# Patient Record
Sex: Male | Born: 1975 | Race: White | Hispanic: No | Marital: Single | Smoking: Current every day smoker
Health system: Southern US, Community
[De-identification: ages and names within clinical notes are randomized; demographics above are authoritative.]

## PROBLEM LIST (undated history)

## (undated) DIAGNOSIS — N189 Chronic kidney disease, unspecified: Secondary | ICD-10-CM

## (undated) DIAGNOSIS — M879 Osteonecrosis, unspecified: Secondary | ICD-10-CM

## (undated) DIAGNOSIS — M1990 Unspecified osteoarthritis, unspecified site: Secondary | ICD-10-CM

## (undated) DIAGNOSIS — K7290 Hepatic failure, unspecified without coma: Secondary | ICD-10-CM

## (undated) DIAGNOSIS — M719 Bursopathy, unspecified: Secondary | ICD-10-CM

## (undated) HISTORY — DX: Hepatic failure, unspecified without coma: K72.90

## (undated) HISTORY — DX: Osteonecrosis, unspecified: M87.9

## (undated) HISTORY — DX: Chronic kidney disease, unspecified: N18.9

---

## 1999-01-01 ENCOUNTER — Inpatient Hospital Stay (HOSPITAL_COMMUNITY): Admission: EM | Admit: 1999-01-01 | Discharge: 1999-01-07 | Payer: Self-pay | Admitting: Emergency Medicine

## 2004-02-21 ENCOUNTER — Emergency Department: Payer: Self-pay | Admitting: Emergency Medicine

## 2005-03-15 ENCOUNTER — Emergency Department: Payer: Self-pay | Admitting: Emergency Medicine

## 2005-11-05 ENCOUNTER — Emergency Department: Payer: Self-pay | Admitting: Emergency Medicine

## 2005-11-16 ENCOUNTER — Emergency Department: Payer: Self-pay | Admitting: Emergency Medicine

## 2005-12-21 ENCOUNTER — Emergency Department: Payer: Self-pay | Admitting: Internal Medicine

## 2006-04-10 ENCOUNTER — Emergency Department: Payer: Self-pay | Admitting: Emergency Medicine

## 2007-08-04 IMAGING — CR CERVICAL SPINE - COMPLETE 4+ VIEW
1 series · 6 of 6 positions shown · non-contrast
Comparison: none

REASON FOR EXAM: fall
COMMENTS:

[Series 1: view not recorded · 0.17mm/px · 6 of 6 slices shown]
[im 1/6]
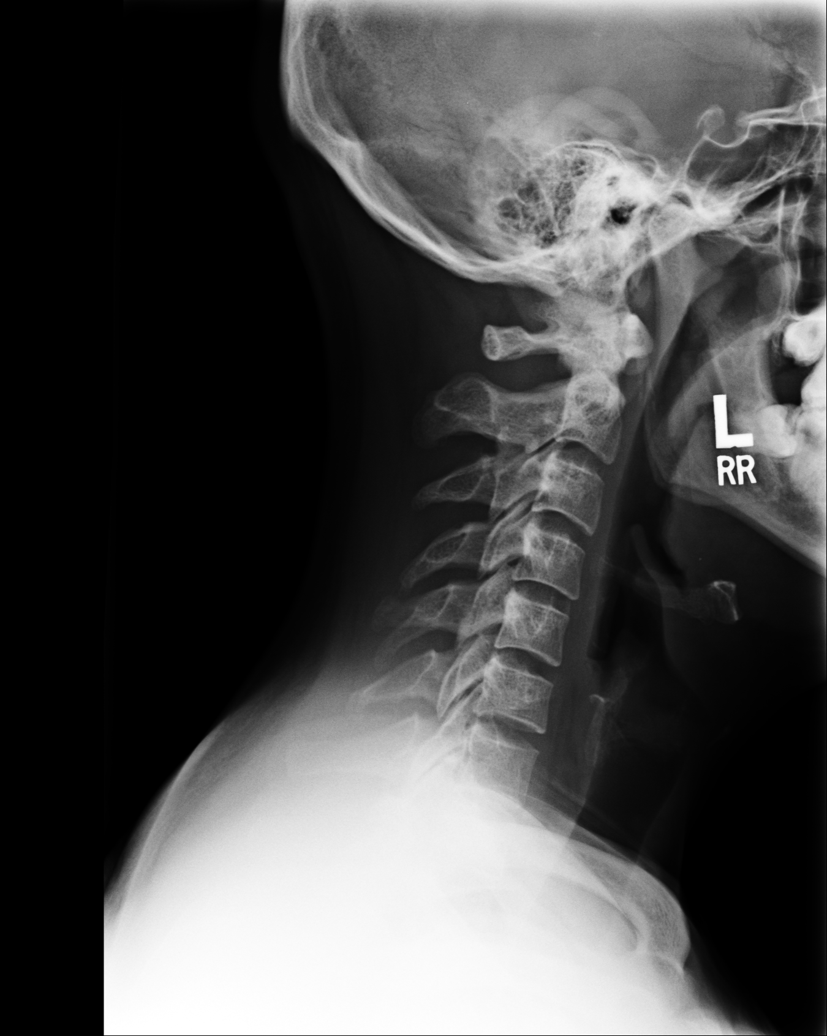
[im 2/6]
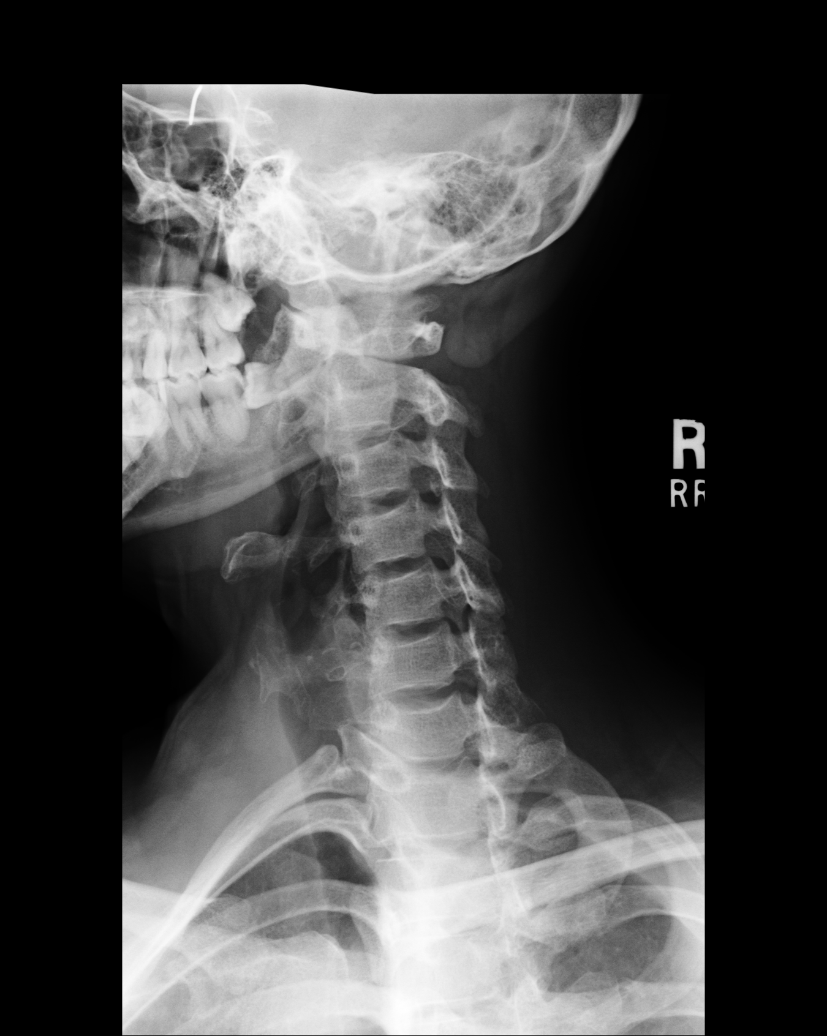
[im 3/6]
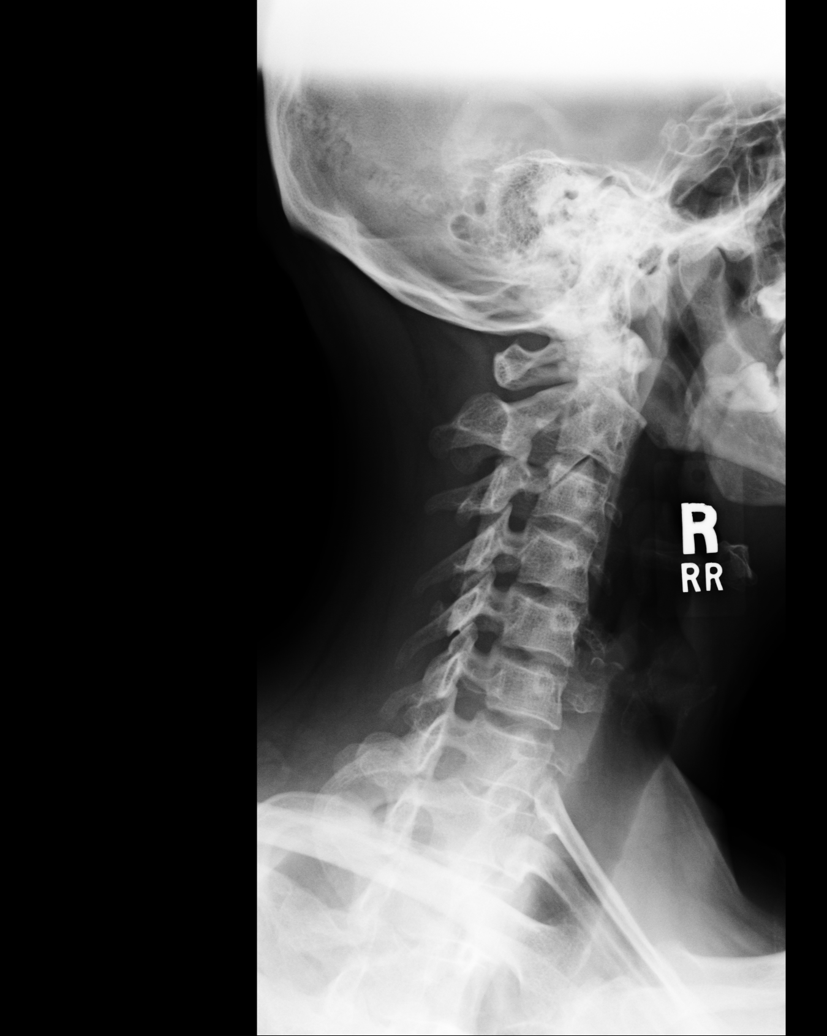
[im 4/6]
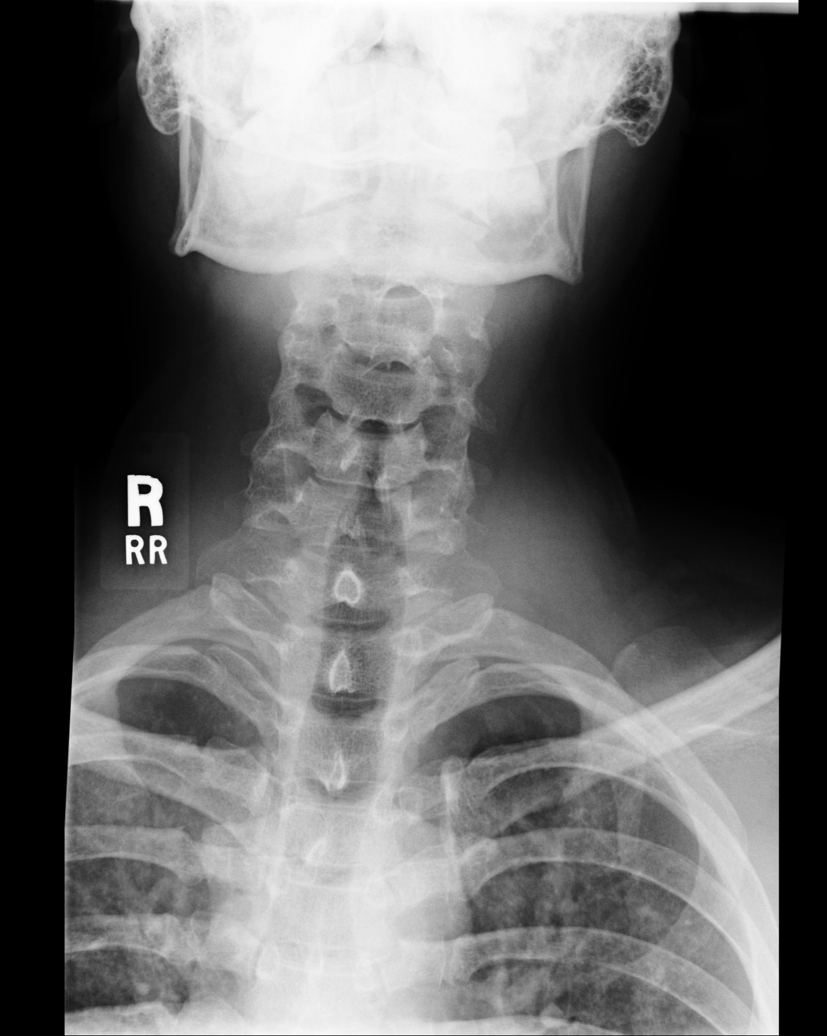
[im 5/6]
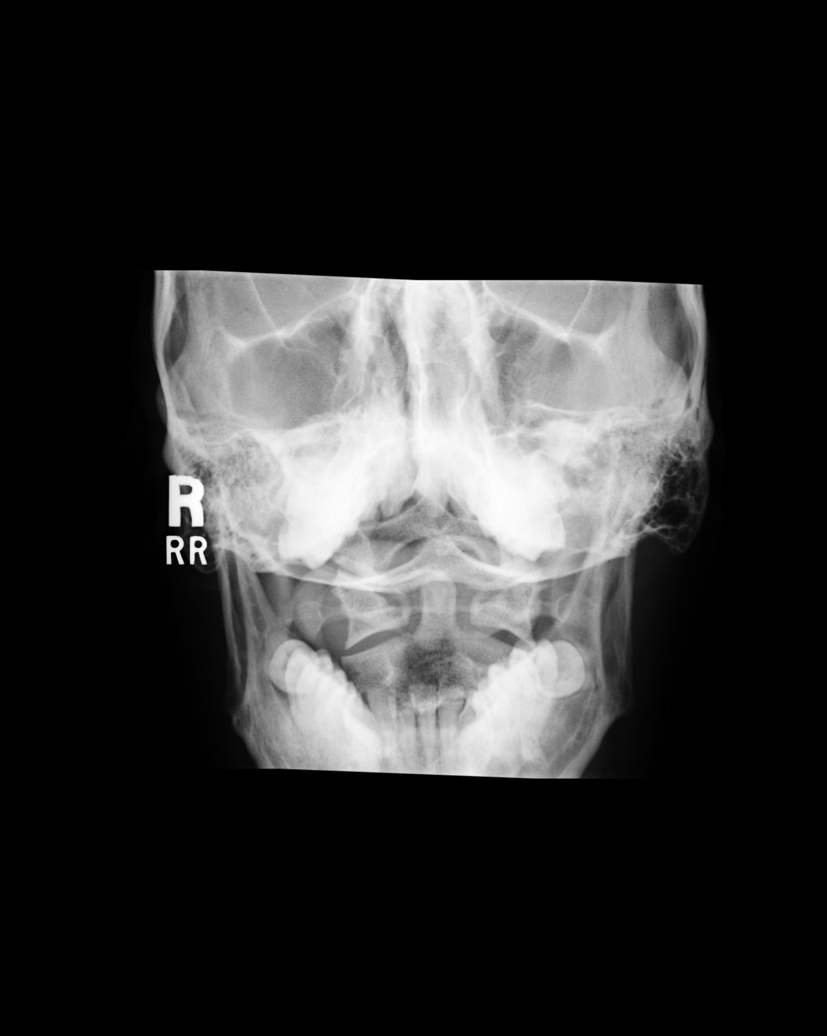
[im 6/6]
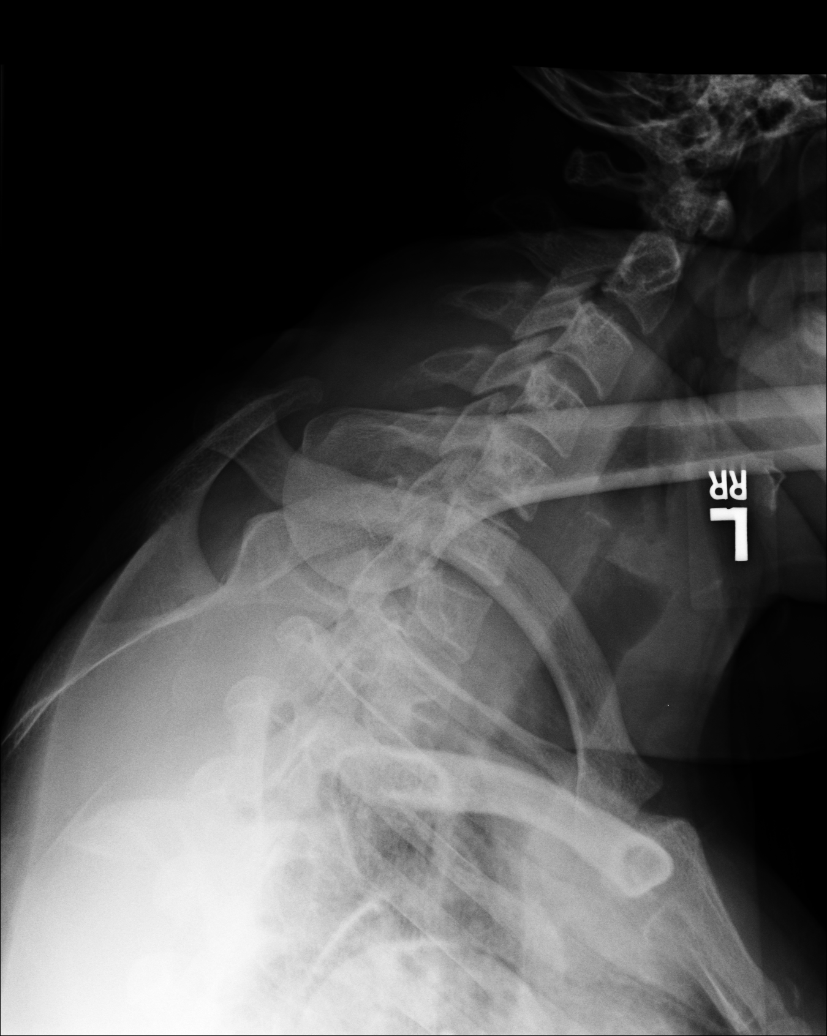

[6 of 6 positions shown; findings below may reference images not displayed]

PROCEDURE:     DXR - DXR CERVICAL SPINE COMPLETE  - November 05, 2005 [DATE]

RESULT:          A complete cervical spine series shows straightening of the
normal cervical lordosis.  A discrete fracture is not evident.  The
prevertebral soft tissues are normal.  The facets appear to be normally
aligned, and the foramina appear to be patent.  The craniocervical junction
and C1-C2 articulation appear to be unremarkable.
IMPRESSION: Loss of the normal cervical lordosis which may be
secondary to muscle spasm.  No definite fracture is seen.  If the patient
has focal neurologic symptoms, then MRI or follow-up cervical spine CT could
be considered.

## 2007-08-15 IMAGING — CT CT ABD-PELV W/ CM
1 of 2 series · 14 of 32 positions shown, 18 images · non-contrast
Comparison: none

REASON FOR EXAM: (1)  Appendicitis; (2) appendicitis rme1
COMMENTS:

[Series 2: soft tissue · axial · 0.67mm/px · z∈[-1600,-1210]mm · 14 of 147 slices shown, 18 images]
[im 11/147  soft-tissue]
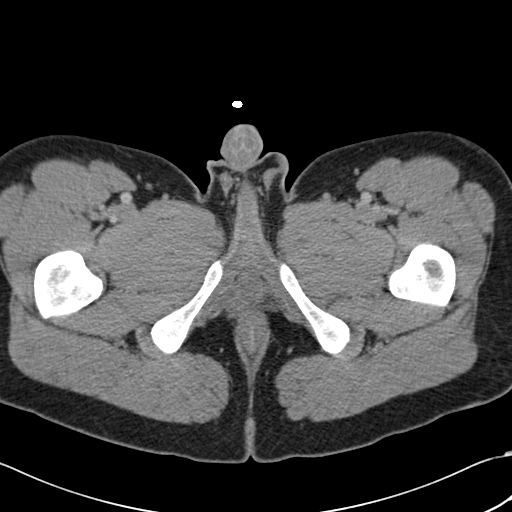
[im 11/147  bone]
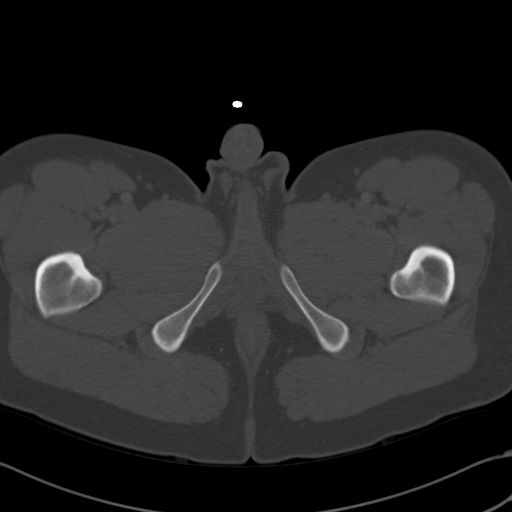
[im 22/147  soft-tissue]
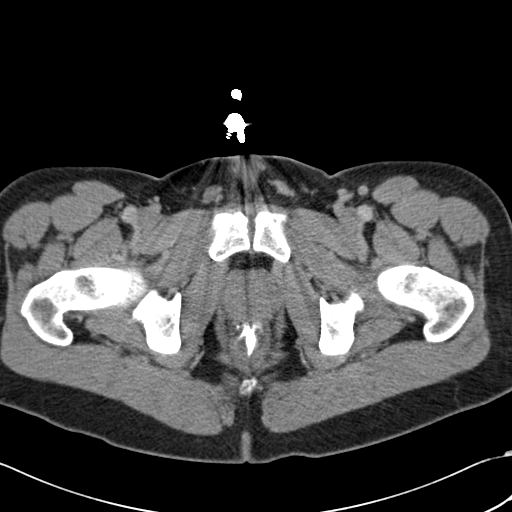
[im 33/147  soft-tissue]
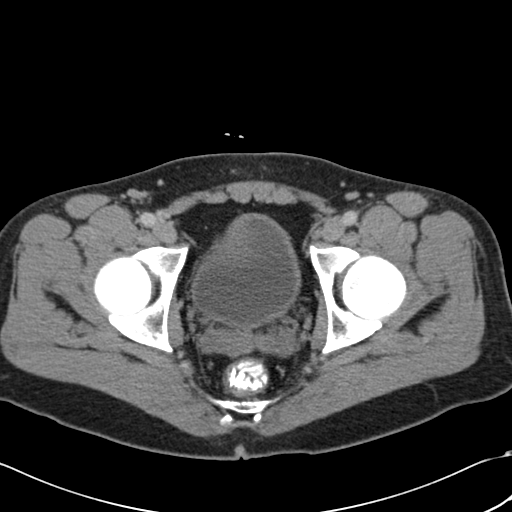
[im 44/147  soft-tissue]
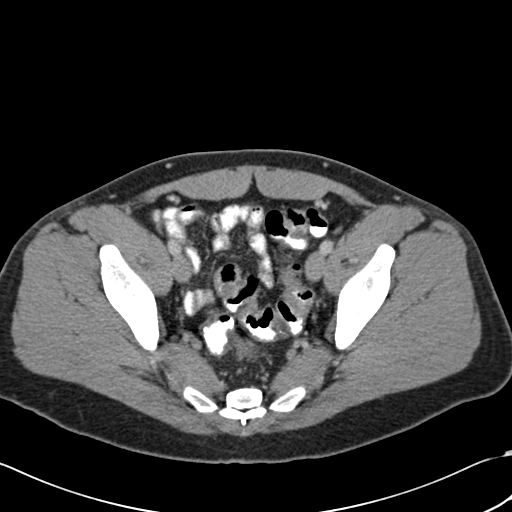
[im 55/147  soft-tissue]
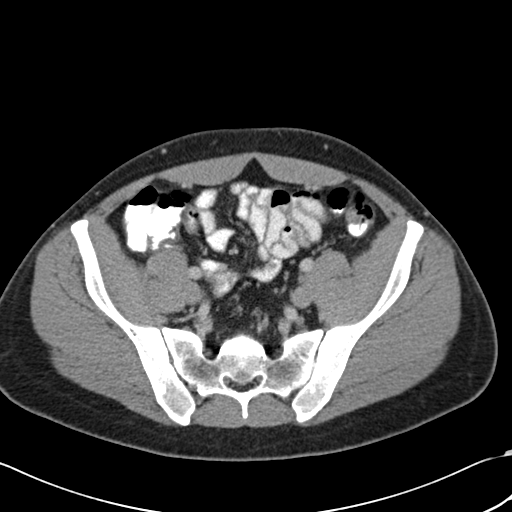
[im 65/147  soft-tissue]
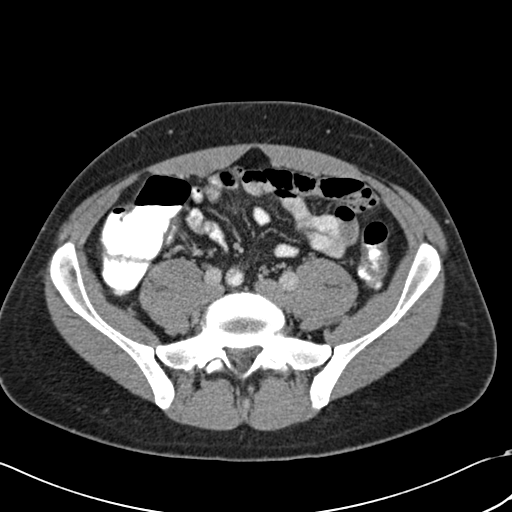
[im 82/147  soft-tissue]
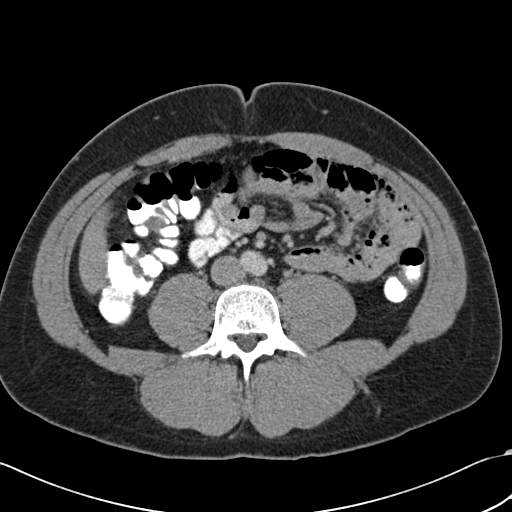
[im 92/147  soft-tissue]
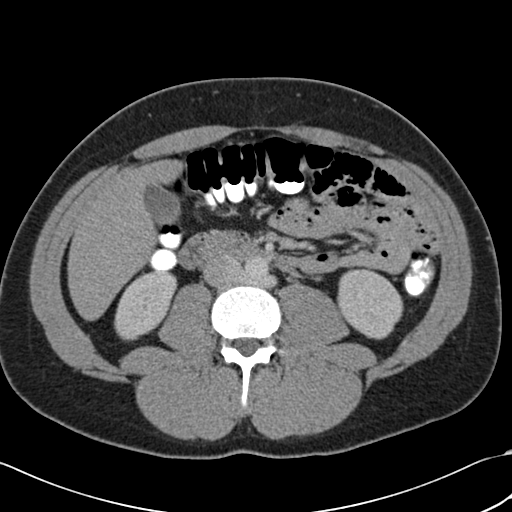
[im 103/147  soft-tissue]
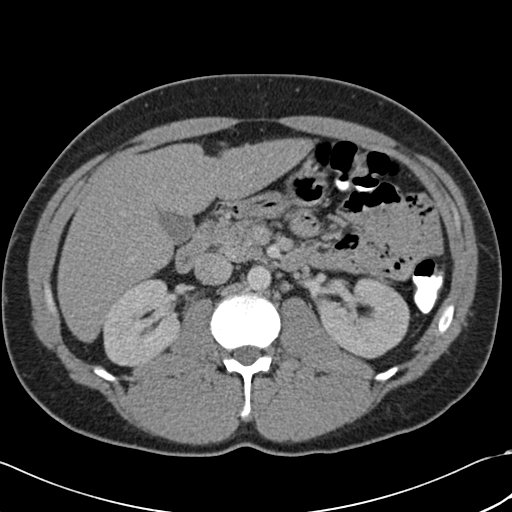
[im 103/147  bone]
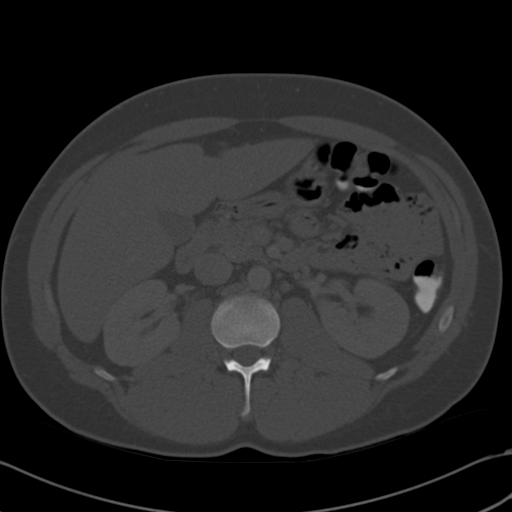
[im 114/147  soft-tissue]
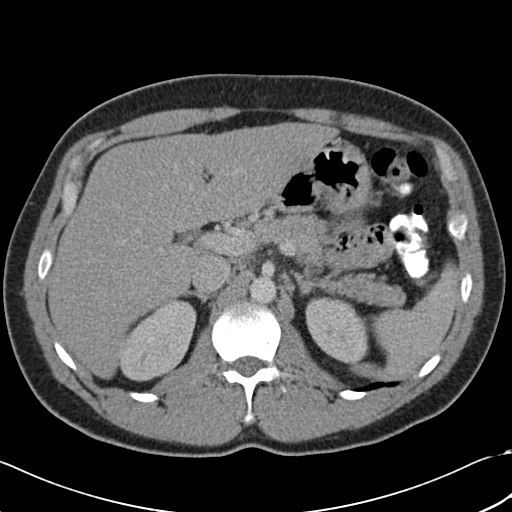
[im 125/147  soft-tissue]
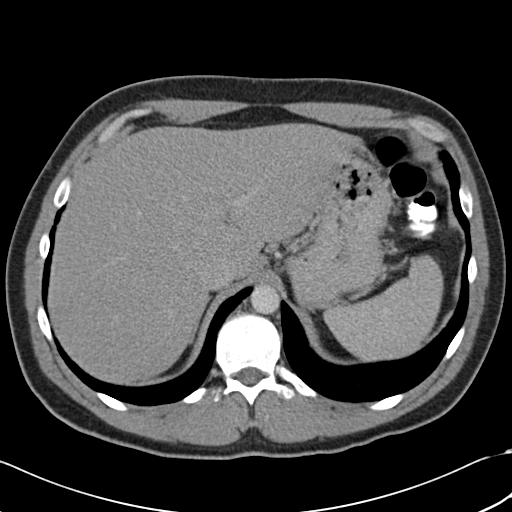
[im 125/147  lung]
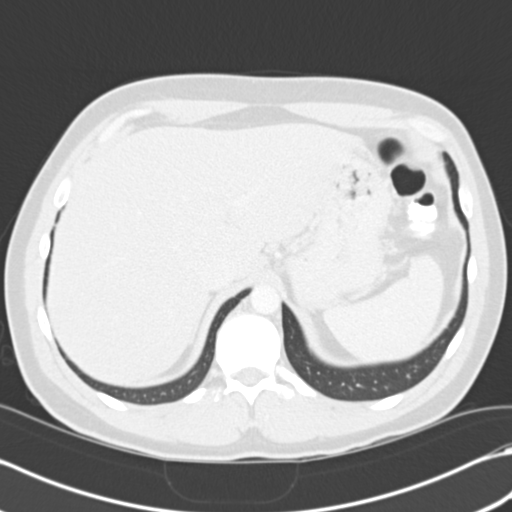
[im 130/147  lung]
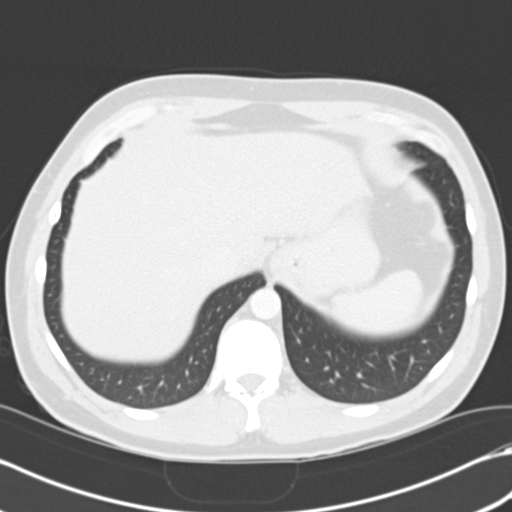
[im 136/147  soft-tissue]
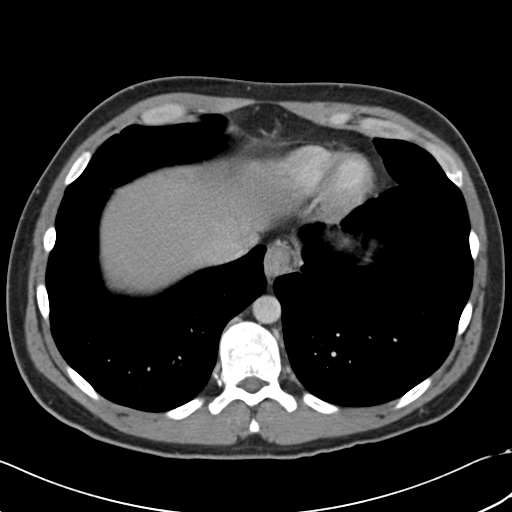
[im 136/147  lung]
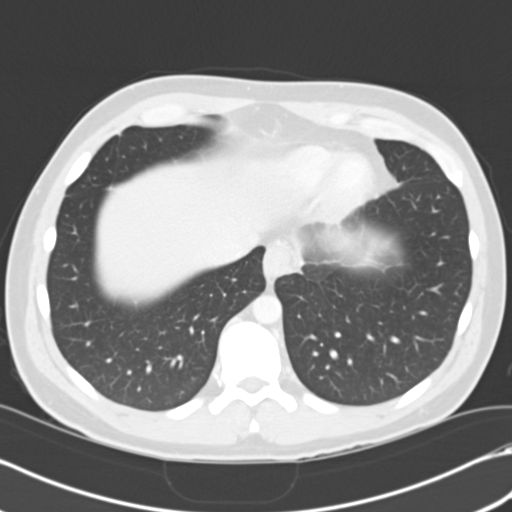
[im 141/147  lung]
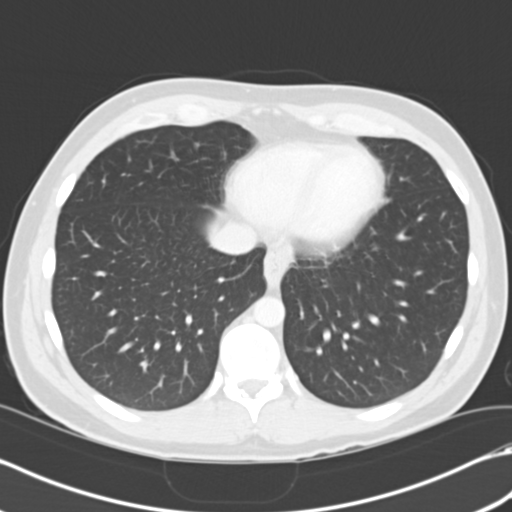

[14 of 32 positions shown; findings below may reference images not displayed]

PROCEDURE:     CT  - CT ABDOMEN / PELVIS  W  - November 16, 2005  [DATE]

RESULT:       The patient is complaining of chronic left lower quadrant
abdominal discomfort but it has worsened more recently.  The patient has
experienced vomiting and diarrhea.

The patient received 100 ml of Osovue-SUJ as well as oral contrast material.
The volume of contrast is relatively low and it is confined to the colon. I
do not see evidence of obstruction. There is thickening of the wall of the
mid and distal sigmoid colon but I do not see significant inflammatory
changes in the surrounding fat. The descending colon is grossly normal. The
ascending and transverse colons exhibit no acute abnormality either. The
small bowel contains only a small amount of the orally administered
contrast. This is predominantly within the ileum.

The liver, gallbladder, pancreas, spleen, nondistended stomach, adrenal
glands, and kidneys are normal in appearance. The caliber of the abdominal
aorta is normal. I see no pathologic sized intraperitoneal or
retroperitoneal lymph nodes. There is no free fluid in the pelvis. The
urinary bladder is partially distended. The prostate gland is mildly
enlarged produces a moderate impression upon the urinary bladder base. There
is no inguinal lymphadenopathy nor evidence of an inguinal hernia. The lung
bases are clear.
IMPRESSION: 1. There is thickening of the wall of the rectum and portions of the sigmoid
colon. This may reflect an inflammatory or infiltrative process. No evidence
of obstruction is seen. There is no evidence of abscess or free fluid or
perforation.
2. The remaining visualized portions of the colon are normal in appearance.
3. I see no evidence of hepatobiliary abnormality nor evidence of urinary
tract obstruction.  A nonobstructing 1 mm diameter midpole calyceal stone on
the right is present. There is also enlargement of the prostate gland with a
prominent impression upon the urinary bladder base.
4.  I do not seen findings suspicious for acute appendicitis.

The findings were called to Dr. Beyer in the Emergency Room in the
conclusion of the study.

## 2013-07-23 ENCOUNTER — Emergency Department: Payer: Self-pay | Admitting: Emergency Medicine

## 2013-07-23 LAB — URINALYSIS, COMPLETE
Bacteria: NONE SEEN
Bilirubin,UR: NEGATIVE
Blood: NEGATIVE
Glucose,UR: NEGATIVE mg/dL (ref 0–75)
Ketone: NEGATIVE
Leukocyte Esterase: NEGATIVE
NITRITE: NEGATIVE
Ph: 6 (ref 4.5–8.0)
Protein: NEGATIVE
SQUAMOUS EPITHELIAL: NONE SEEN
Specific Gravity: 1.016 (ref 1.003–1.030)
WBC UR: 2 /HPF (ref 0–5)

## 2013-07-23 LAB — COMPREHENSIVE METABOLIC PANEL
ALK PHOS: 125 U/L — AB
AST: 38 U/L — AB (ref 15–37)
Albumin: 3.6 g/dL (ref 3.4–5.0)
Anion Gap: 8 (ref 7–16)
BILIRUBIN TOTAL: 2.2 mg/dL — AB (ref 0.2–1.0)
BUN: 13 mg/dL (ref 7–18)
CALCIUM: 9.6 mg/dL (ref 8.5–10.1)
Chloride: 109 mmol/L — ABNORMAL HIGH (ref 98–107)
Co2: 20 mmol/L — ABNORMAL LOW (ref 21–32)
Creatinine: 1.21 mg/dL (ref 0.60–1.30)
EGFR (Non-African Amer.): >60
GLUCOSE: 96 mg/dL (ref 65–99)
Osmolality: 274 (ref 275–301)
POTASSIUM: 3.8 mmol/L (ref 3.5–5.1)
SGPT (ALT): 20 U/L (ref 12–78)
Sodium: 137 mmol/L (ref 136–145)
Total Protein: 7.6 g/dL (ref 6.4–8.2)

## 2013-07-23 LAB — LIPASE, BLOOD: Lipase: 168 U/L (ref 73–393)

## 2013-07-23 LAB — CBC WITH DIFFERENTIAL/PLATELET
BASOS PCT: 1.1 %
Basophil #: 0.1 10*3/uL (ref 0.0–0.1)
Eosinophil #: 0.2 10*3/uL (ref 0.0–0.7)
Eosinophil %: 2.3 %
HCT: 30.6 % — ABNORMAL LOW (ref 40.0–52.0)
HGB: 9.7 g/dL — ABNORMAL LOW (ref 13.0–18.0)
Lymphocyte #: 1.4 10*3/uL (ref 1.0–3.6)
Lymphocyte %: 18 %
MCH: 26 pg (ref 26.0–34.0)
MCHC: 31.7 g/dL — AB (ref 32.0–36.0)
MCV: 82 fL (ref 80–100)
MONOS PCT: 8.3 %
Monocyte #: 0.6 x10 3/mm (ref 0.2–1.0)
Neutrophil #: 5.3 10*3/uL (ref 1.4–6.5)
Neutrophil %: 70.3 %
Platelet: 143 10*3/uL — ABNORMAL LOW (ref 150–440)
RBC: 3.74 10*6/uL — AB (ref 4.40–5.90)
RDW: 16.9 % — ABNORMAL HIGH (ref 11.5–14.5)
WBC: 7.6 10*3/uL (ref 3.8–10.6)

## 2019-02-15 HISTORY — PX: REPLACEMENT TOTAL KNEE: SUR1224

## 2019-02-15 HISTORY — PX: TOTAL HIP ARTHROPLASTY: SHX124

## 2021-02-16 ENCOUNTER — Encounter: Payer: Self-pay | Admitting: Emergency Medicine

## 2021-02-16 ENCOUNTER — Emergency Department
Admission: EM | Admit: 2021-02-16 | Discharge: 2021-02-16 | Disposition: A | Discharge status: Home / Self Care | Payer: Self-pay | Attending: Emergency Medicine | Admitting: Emergency Medicine

## 2021-02-16 DIAGNOSIS — R7309 Other abnormal glucose: Secondary | ICD-10-CM | POA: Insufficient documentation

## 2021-02-16 DIAGNOSIS — Z20822 Contact with and (suspected) exposure to covid-19: Secondary | ICD-10-CM | POA: Insufficient documentation

## 2021-02-16 DIAGNOSIS — T40411A Poisoning by fentanyl or fentanyl analogs, accidental (unintentional), initial encounter: Secondary | ICD-10-CM | POA: Insufficient documentation

## 2021-02-16 DIAGNOSIS — T40601A Poisoning by unspecified narcotics, accidental (unintentional), initial encounter: Secondary | ICD-10-CM

## 2021-02-16 HISTORY — DX: Unspecified osteoarthritis, unspecified site: M19.90

## 2021-02-16 LAB — ETHANOL: Alcohol, Ethyl (B): 245 mg/dL — ABNORMAL HIGH (ref <10)

## 2021-02-16 LAB — CBC
HCT: 33.9 % — ABNORMAL LOW (ref 39.0–52.0)
Hemoglobin: 10 g/dL — ABNORMAL LOW (ref 13.0–17.0)
MCH: 22.7 pg — ABNORMAL LOW (ref 26.0–34.0)
MCHC: 29.5 g/dL — ABNORMAL LOW (ref 30.0–36.0)
MCV: 76.9 fL — ABNORMAL LOW (ref 80.0–100.0)
Platelets: 150 10*3/uL (ref 150–400)
RBC: 4.41 MIL/uL (ref 4.22–5.81)
RDW: 19.8 % — ABNORMAL HIGH (ref 11.5–15.5)
WBC: 3.7 10*3/uL — ABNORMAL LOW (ref 4.0–10.5)
nRBC: 0 % (ref 0.0–0.2)

## 2021-02-16 LAB — RESP PANEL BY RT-PCR (FLU A&B, COVID) ARPGX2
Influenza A by PCR: NEGATIVE
Influenza B by PCR: NEGATIVE
SARS Coronavirus 2 by RT PCR: NEGATIVE

## 2021-02-16 LAB — SALICYLATE LEVEL: Salicylate Lvl: <7 mg/dL — ABNORMAL LOW (ref 7.0–30.0)

## 2021-02-16 LAB — COMPREHENSIVE METABOLIC PANEL
ALT: 33 U/L (ref 0–44)
AST: 65 U/L — ABNORMAL HIGH (ref 15–41)
Albumin: 3.4 g/dL — ABNORMAL LOW (ref 3.5–5.0)
Alkaline Phosphatase: 140 U/L — ABNORMAL HIGH (ref 38–126)
Anion gap: 7 (ref 5–15)
BUN: 14 mg/dL (ref 6–20)
CO2: 21 mmol/L — ABNORMAL LOW (ref 22–32)
Calcium: 8.9 mg/dL (ref 8.9–10.3)
Chloride: 116 mmol/L — ABNORMAL HIGH (ref 98–111)
Creatinine, Ser: 0.91 mg/dL (ref 0.61–1.24)
GFR, Estimated: >60 mL/min (ref >60)
Glucose, Bld: 121 mg/dL — ABNORMAL HIGH (ref 70–99)
Potassium: 4 mmol/L (ref 3.5–5.1)
Sodium: 144 mmol/L (ref 135–145)
Total Bilirubin: 1.5 mg/dL — ABNORMAL HIGH (ref 0.3–1.2)
Total Protein: 7.7 g/dL (ref 6.5–8.1)

## 2021-02-16 LAB — CBG MONITORING, ED: Glucose-Capillary: 150 mg/dL — ABNORMAL HIGH (ref 70–99)

## 2021-02-16 LAB — TROPONIN I (HIGH SENSITIVITY): Troponin I (High Sensitivity): 5 ng/L (ref <18)

## 2021-02-16 LAB — ACETAMINOPHEN LEVEL: Acetaminophen (Tylenol), Serum: <10 ug/mL — ABNORMAL LOW (ref 10–30)

## 2021-02-16 MED ORDER — SODIUM CHLORIDE 0.9 % IV BOLUS
1000.0000 mL | Freq: Once | INTRAVENOUS | Status: AC
  Administered 2021-02-16: 1000 mL via INTRAVENOUS

## 2021-02-16 NOTE — ED Provider Notes (Signed)
[facility] Provider Note    Event Date/Time   First MD Initiated Contact with Patient 02/16/21 2107     (approximate)   History   Drug Overdose   HPI  Stephen Mayer is a 46 y.o. male with a history of chronic pain and fentanyl abuse who presents with an unintentional overdose of fentanyl.  Per EMS, the patient was found in his apartment where he was witnessed collapsing to the ground.  EMS noted agonal respirations on their arrival.  They gave Narcan intranasally and then IV.  The patient immediately awoke and became fully alert and oriented.  The patient endorses using fentanyl and drinking alcohol.  He states that the overdose was accidental, denies any SI or HI or any other intention for self-harm.  He denies any shortness of breath, nausea, or other acute symptoms and states he feels fine.     Physical Exam   Triage Vital Signs: ED Triage Vitals  Enc Vitals Group     BP 02/16/21 2029 128/76     Pulse Rate 02/16/21 2029 (!) 109     Resp 02/16/21 2029 12     Temp 02/16/21 2029 98.5 F (36.9 C)     Temp Source 02/16/21 2029 Oral     SpO2 02/16/21 2029 100 %     Weight 02/16/21 2023 150 lb (68 kg)     Height 02/16/21 2023 5\' 7"  (1.702 m)     Head Circumference --      Peak Flow --      Pain Score --      Pain Loc --      Pain Edu? --      Excl. in GC? --     Most recent vital signs: Vitals:   02/16/21 2130 02/16/21 2200  BP: 129/87 123/85  Pulse: (!) 114 100  Resp: 19   Temp:    SpO2: 98% 98%     General: Awake, oriented x4, no distress.  CV:  Good peripheral perfusion.  Resp:  Normal effort.  Abd:  Soft and nontender.  No distention.  Other:  Motor and sensory intact in all extremities.  No trauma.   ED Results / Procedures / Treatments   Labs (all labs ordered are listed, but only abnormal results are displayed) Labs Reviewed  COMPREHENSIVE METABOLIC PANEL - Abnormal; Notable for the following components:      Result  Value   Chloride 116 (*)    CO2 21 (*)    Glucose, Bld 121 (*)    Albumin 3.4 (*)    AST 65 (*)    Alkaline Phosphatase 140 (*)    Total Bilirubin 1.5 (*)    All other components within normal limits  ETHANOL - Abnormal; Notable for the following components:   Alcohol, Ethyl (B) 245 (*)    All other components within normal limits  SALICYLATE LEVEL - Abnormal; Notable for the following components:   Salicylate Lvl <7.0 (*)    All other components within normal limits  ACETAMINOPHEN LEVEL - Abnormal; Notable for the following components:   Acetaminophen (Tylenol), Serum <10 (*)    All other components within normal limits  CBC - Abnormal; Notable for the following components:   WBC 3.7 (*)    Hemoglobin 10.0 (*)    HCT 33.9 (*)    MCV 76.9 (*)    MCH 22.7 (*)    MCHC 29.5 (*)    RDW 19.8 (*)  All other components within normal limits  CBG MONITORING, ED - Abnormal; Notable for the following components:   Glucose-Capillary 150 (*)    All other components within normal limits  RESP PANEL BY RT-PCR (FLU A&B, COVID) ARPGX2  URINE DRUG SCREEN, QUALITATIVE ([facility] ONLY)  TROPONIN I (HIGH SENSITIVITY)     EKG  ED ECG REPORT I, Dionne BucySebastian Cheryal Salas, the attending physician, personally viewed and interpreted this ECG.  Date: 02/16/2021 EKG Time: 2025 Rate: 120 Rhythm: sinus tachycardia QRS Axis: left axis Intervals: normal ST/T Wave abnormalities: normal; early repolarization Narrative Interpretation: no evidence of acute ischemia    RADIOLOGY   PROCEDURES:  Critical Care performed: No  Procedures   MEDICATIONS ORDERED IN ED: Medications  sodium chloride 0.9 % bolus 1,000 mL (1,000 mLs Intravenous New Bag/Given 02/16/21 2150)     IMPRESSION / MDM / ASSESSMENT AND PLAN / ED COURSE  I reviewed the triage vital signs and the nursing notes.  46 year old male with history of chronic pain and fentanyl abuse presents after an apparent accidental fentanyl overdose.   He was also drinking alcohol.  He was found with agonal respirations but responded immediately to IV Narcan given by EMS.  He denies any acute complaints at this time and denies SI.  I reviewed the past medical records.  The patient's last medical encounter that we have a record of [facility].  At that time he was recommended for cervical medial branch block for his chronic pain.  He has not had any documented visits since that time.  On exam his vital signs are normal except for tachycardia.  O2 saturation is 100% on room air.  He is slightly intoxicated appearing but awake.  He does not demonstrate any respiratory distress or increased work of breathing.  Neuro exam is nonfocal and there is no visible trauma.  Overall presentation is consistent with accidental opiate overdose, likely with a component of alcohol intoxication as well.  We will obtain lab work-up, give fluids, and monitor the patient for approximately 3 hours.  The patient is on the cardiac monitor to evaluate for evidence of arrhythmia and/or significant heart rate changes.  ----------------------------------------- 10:55 PM on 02/16/2021 -----------------------------------------  Lab Is overall reassuring.  The patient's ethanol level is elevated consistent with acute intoxication.  Chemistry shows slightly elevated chloride and low bicarb as well as mildly elevated LFTs and bilirubin, all of which appear to be chronic for the patient based on old labs.  He is also anemic.  On reassessment, the patient's heart rate has normalized after fluids.  He remains awake, with normal spontaneous respirations and O2 saturation in the high 90s on room air.  The patient's father is here now and will be taking the patient home.  He states he will be with the patient tonight.  They both would like to go home at this time.  I had the patient walk and he is able to ambulate without difficulty.  He appears clinically  sober.  Since the patient has now been in ED for 2.5 hours with no recurrence of his symptoms, he is appropriate for discharge.  I gave them both return precautions and they expressed understanding.  I have provided a list of outpatient substance abuse resources.   FINAL CLINICAL IMPRESSION(S) / ED DIAGNOSES   Final diagnoses:  Opiate overdose, accidental or unintentional, initial encounter [facility](HCC)     Rx / DC Orders   ED Discharge Orders  None        Note:  This document was prepared using Dragon voice recognition software and may include unintentional dictation errors.    Dionne Bucy, MD 02/16/21 2258

## 2021-02-16 NOTE — ED Notes (Addendum)
Nurse walked into room and patient was at in room toilet and urinated. Unsteady on feet and pulled leads off. Let pt know to use urinal next time and we need sample and to not fall.

## 2021-02-16 NOTE — ED Notes (Signed)
Pulled IV- handed discharges to dad. Placed pt in wheelchair for tech to take to dad who was pulling around car.

## 2021-02-16 NOTE — ED Notes (Signed)
Went into room and pt agitated and trying to get out of bed and pull off all wires. Told patient to stay in bed and for urination he needed to use the urinal and not fall and we needed a sample. Patient confused and disoriented. Pt compliant and sat back in bed and reapplied leads.

## 2021-02-16 NOTE — ED Triage Notes (Signed)
Pt arrived via [facility] from apartment where he was witnessed collapsing to ground. EMS reports pt agonal breathing on arrival. EMS administered 4mg  Narcan intranasal with no response and then 4mg  IV Narcan with pt responding and becoming A&O. Pt arrived A&O x4, able to answer all question asked. Pt admits to ETOH and snorting Fentanyl. Pt denies Si and HI.

## 2022-06-22 ENCOUNTER — Other Ambulatory Visit: Payer: Self-pay

## 2022-06-22 ENCOUNTER — Emergency Department
Admission: EM | Admit: 2022-06-22 | Discharge: 2022-06-22 | Disposition: A | Discharge status: Home / Self Care | Payer: Medicaid - Out of State | Attending: Emergency Medicine | Admitting: Emergency Medicine

## 2022-06-22 DIAGNOSIS — K769 Liver disease, unspecified: Secondary | ICD-10-CM | POA: Diagnosis not present

## 2022-06-22 DIAGNOSIS — M7989 Other specified soft tissue disorders: Secondary | ICD-10-CM | POA: Diagnosis present

## 2022-06-22 DIAGNOSIS — R600 Localized edema: Secondary | ICD-10-CM | POA: Diagnosis not present

## 2022-06-22 LAB — BASIC METABOLIC PANEL
Anion gap: 5 (ref 5–15)
BUN: 8 mg/dL (ref 6–20)
CO2: 22 mmol/L (ref 22–32)
Calcium: 8 mg/dL — ABNORMAL LOW (ref 8.9–10.3)
Chloride: 111 mmol/L (ref 98–111)
Creatinine, Ser: 0.96 mg/dL (ref 0.61–1.24)
GFR, Estimated: >60 mL/min (ref >60)
Glucose, Bld: 76 mg/dL (ref 70–99)
Potassium: 3.5 mmol/L (ref 3.5–5.1)
Sodium: 138 mmol/L (ref 135–145)

## 2022-06-22 LAB — CBC
HCT: 32.3 % — ABNORMAL LOW (ref 39.0–52.0)
Hemoglobin: 9.2 g/dL — ABNORMAL LOW (ref 13.0–17.0)
MCH: 20.4 pg — ABNORMAL LOW (ref 26.0–34.0)
MCHC: 28.5 g/dL — ABNORMAL LOW (ref 30.0–36.0)
MCV: 71.5 fL — ABNORMAL LOW (ref 80.0–100.0)
Platelets: 129 10*3/uL — ABNORMAL LOW (ref 150–400)
RBC: 4.52 MIL/uL (ref 4.22–5.81)
RDW: 18.7 % — ABNORMAL HIGH (ref 11.5–15.5)
WBC: 6 10*3/uL (ref 4.0–10.5)
nRBC: 0 % (ref 0.0–0.2)

## 2022-06-22 LAB — BRAIN NATRIURETIC PEPTIDE: B Natriuretic Peptide: 106.1 pg/mL — ABNORMAL HIGH (ref 0.0–100.0)

## 2022-06-22 MED ORDER — FUROSEMIDE 20 MG PO TABS: 20.0000 mg | ORAL_TABLET | Freq: Every day | ORAL | 0 refills | Status: DC

## 2022-06-22 NOTE — ED Provider Notes (Signed)
[facility] Provider Note    Event Date/Time   First MD Initiated Contact with Patient 06/22/22 1556     (approximate)   History   Chief Complaint Leg Swelling   HPI  Stephen Mayer is a 47 y.o. male with past medical history of arthritis, chronic liver disease, and alcohol abuse who presents to the ED complaining of leg swelling.  Patient reports that over the past 3 days he has been dealing with increasing swelling and both of his legs.  He states that the legs are uncomfortable due to the swelling but denies overt pain.  He has not noticed any redness or wounds to the legs, denies any difficulty breathing or pain in his chest.  He states he has dealt with similar symptoms previously and reports a history of liver disease due to alcohol abuse.  He had been sober for a long time, but states that he took up drinking again about 6 months ago.  He recently relocated to the area from [place] and does not have a primary care doctor.     Physical Exam   Triage Vital Signs: ED Triage Vitals  Enc Vitals Group     BP 06/22/22 1434 124/71     Pulse Rate 06/22/22 1434 91     Resp 06/22/22 1434 18     Temp 06/22/22 1434 98.3 F (36.8 C)     Temp Source 06/22/22 1434 Oral     SpO2 06/22/22 1434 97 %     Weight 06/22/22 1434 180 lb (81.6 kg)     Height 06/22/22 1434 5\' 7"  (1.702 m)     Head Circumference --      Peak Flow --      Pain Score 06/22/22 1444 0     Pain Loc --      Pain Edu? --      Excl. in GC? --     Most recent vital signs: Vitals:   06/22/22 1434  BP: 124/71  Pulse: 91  Resp: 18  Temp: 98.3 F (36.8 C)  SpO2: 97%    Constitutional: Alert and oriented. Eyes: Conjunctivae are normal. Head: Atraumatic. Nose: No congestion/rhinnorhea. Mouth/Throat: Mucous membranes are moist.  Cardiovascular: Normal rate, regular rhythm. Grossly normal heart sounds.  2+ radial and DP pulses bilaterally. Respiratory: Normal respiratory effort.   No retractions. Lungs CTAB. Gastrointestinal: Soft and nontender. No distention. Musculoskeletal: No lower extremity tenderness, 1+ pitting edema to knees bilaterally. Neurologic:  Normal speech and language. No gross focal neurologic deficits are appreciated.    ED Results / Procedures / Treatments   Labs (all labs ordered are listed, but only abnormal results are displayed) Labs Reviewed  BASIC METABOLIC PANEL - Abnormal; Notable for the following components:      Result Value   Calcium 8.0 (*)    All other components within normal limits  CBC - Abnormal; Notable for the following components:   Hemoglobin 9.2 (*)    HCT 32.3 (*)    MCV 71.5 (*)    MCH 20.4 (*)    MCHC 28.5 (*)    RDW 18.7 (*)    Platelets 129 (*)    All other components within normal limits  BRAIN NATRIURETIC PEPTIDE - Abnormal; Notable for the following components:   B Natriuretic Peptide 106.1 (*)    All other components within normal limits  URINALYSIS, ROUTINE W REFLEX MICROSCOPIC  CBG MONITORING, ED    PROCEDURES:  Critical Care performed: No  Procedures   MEDICATIONS ORDERED IN ED: Medications - No data to display   IMPRESSION / MDM / ASSESSMENT AND PLAN / ED COURSE  I reviewed the triage vital signs and the nursing notes.                              47 y.o. male with past medical history of arthritis, alcohol abuse, and chronic liver disease who presents to the ED with increasing swelling in his legs over the past 2 days.  Patient's presentation is most consistent with acute presentation with potential threat to life or bodily function.  Differential diagnosis includes, but is not limited to, renal failure, liver failure, electrolyte abnormality, CHF, DVT, venous stasis.  Patient nontoxic-appearing and in no acute distress, vital signs are unremarkable.  He has peripheral edema, but is neurovascular intact to his bilateral lower extremities with strong DP pulses.  No ascites noted on  exam and he has no abdominal tenderness.  He denies any difficulty breathing and I doubt CHF.  Labs are reassuring with anemia stable compared to previous, no significant leukocytosis, electrode abnormality, or AKI.  Symptoms likely due to patient's known liver disease and I offered to check his LFTs here in the ED, however patient would like to be discharged as soon as possible.  He does not want to stay for additional labs, but would like a prescription for Lasix to see if this helps.  He was also counseled on keeping his legs elevated and use compression stockings.  Referral provided to establish with PCP and he was counseled to return to the ED for new or worsening symptoms.  Patient agrees with plan.      FINAL CLINICAL IMPRESSION(S) / ED DIAGNOSES   Final diagnoses:  Peripheral edema  Chronic liver disease     Rx / DC Orders   ED Discharge Orders          Ordered    furosemide (LASIX) 20 MG tablet  Daily        06/22/22 1650    Ambulatory Referral to Primary Care (Establish Care)        06/22/22 1650             Note:  This document was prepared using Dragon voice recognition software and may include unintentional dictation errors.   Chesley Noon, MD 06/22/22 [phone]

## 2022-06-22 NOTE — ED Triage Notes (Signed)
Pt presents to ED with c/o of BLE swelling. Pt states HX of liver cirrhosis. Pt states ABD swelling as well.

## 2022-08-24 ENCOUNTER — Encounter: Payer: Self-pay | Admitting: Emergency Medicine

## 2022-08-24 ENCOUNTER — Emergency Department: Payer: BLUE CROSS/BLUE SHIELD

## 2022-08-24 ENCOUNTER — Other Ambulatory Visit: Payer: Self-pay

## 2022-08-24 ENCOUNTER — Inpatient Hospital Stay
Admission: EM | Admit: 2022-08-24 | Discharge: 2022-08-27 | DRG: 378 | Discharge status: Against Medical Advice | Payer: BLUE CROSS/BLUE SHIELD | Attending: Internal Medicine | Admitting: Internal Medicine

## 2022-08-24 DIAGNOSIS — E872 Acidosis, unspecified: Secondary | ICD-10-CM | POA: Diagnosis not present

## 2022-08-24 DIAGNOSIS — E876 Hypokalemia: Secondary | ICD-10-CM | POA: Diagnosis present

## 2022-08-24 DIAGNOSIS — Z833 Family history of diabetes mellitus: Secondary | ICD-10-CM

## 2022-08-24 DIAGNOSIS — K920 Hematemesis: Secondary | ICD-10-CM | POA: Diagnosis not present

## 2022-08-24 DIAGNOSIS — K449 Diaphragmatic hernia without obstruction or gangrene: Secondary | ICD-10-CM | POA: Diagnosis present

## 2022-08-24 DIAGNOSIS — R112 Nausea with vomiting, unspecified: Principal | ICD-10-CM

## 2022-08-24 DIAGNOSIS — K922 Gastrointestinal hemorrhage, unspecified: Secondary | ICD-10-CM | POA: Diagnosis present

## 2022-08-24 DIAGNOSIS — I8510 Secondary esophageal varices without bleeding: Secondary | ICD-10-CM | POA: Diagnosis present

## 2022-08-24 DIAGNOSIS — K861 Other chronic pancreatitis: Secondary | ICD-10-CM | POA: Diagnosis present

## 2022-08-24 DIAGNOSIS — K766 Portal hypertension: Secondary | ICD-10-CM | POA: Diagnosis present

## 2022-08-24 DIAGNOSIS — F1010 Alcohol abuse, uncomplicated: Secondary | ICD-10-CM | POA: Diagnosis present

## 2022-08-24 DIAGNOSIS — G8929 Other chronic pain: Secondary | ICD-10-CM | POA: Diagnosis present

## 2022-08-24 DIAGNOSIS — Z823 Family history of stroke: Secondary | ICD-10-CM

## 2022-08-24 DIAGNOSIS — K7030 Alcoholic cirrhosis of liver without ascites: Secondary | ICD-10-CM | POA: Diagnosis present

## 2022-08-24 DIAGNOSIS — R7989 Other specified abnormal findings of blood chemistry: Secondary | ICD-10-CM | POA: Diagnosis not present

## 2022-08-24 DIAGNOSIS — F17210 Nicotine dependence, cigarettes, uncomplicated: Secondary | ICD-10-CM | POA: Diagnosis present

## 2022-08-24 DIAGNOSIS — Z79899 Other long term (current) drug therapy: Secondary | ICD-10-CM

## 2022-08-24 DIAGNOSIS — K2961 Other gastritis with bleeding: Principal | ICD-10-CM | POA: Diagnosis present

## 2022-08-24 DIAGNOSIS — Z5329 Procedure and treatment not carried out because of patient's decision for other reasons: Secondary | ICD-10-CM | POA: Diagnosis present

## 2022-08-24 DIAGNOSIS — D61818 Other pancytopenia: Secondary | ICD-10-CM | POA: Diagnosis present

## 2022-08-24 DIAGNOSIS — K8689 Other specified diseases of pancreas: Secondary | ICD-10-CM | POA: Insufficient documentation

## 2022-08-24 DIAGNOSIS — K3189 Other diseases of stomach and duodenum: Secondary | ICD-10-CM | POA: Diagnosis present

## 2022-08-24 LAB — COMPREHENSIVE METABOLIC PANEL
ALT: 82 U/L — ABNORMAL HIGH (ref 0–44)
AST: 226 U/L — ABNORMAL HIGH (ref 15–41)
Albumin: 3.3 g/dL — ABNORMAL LOW (ref 3.5–5.0)
Alkaline Phosphatase: 168 U/L — ABNORMAL HIGH (ref 38–126)
Anion gap: 13 (ref 5–15)
BUN: 10 mg/dL (ref 6–20)
CO2: 18 mmol/L — ABNORMAL LOW (ref 22–32)
Calcium: 8.1 mg/dL — ABNORMAL LOW (ref 8.9–10.3)
Chloride: 110 mmol/L (ref 98–111)
Creatinine, Ser: 0.78 mg/dL (ref 0.61–1.24)
GFR, Estimated: >60 mL/min (ref >60)
Glucose, Bld: 101 mg/dL — ABNORMAL HIGH (ref 70–99)
Potassium: 3.4 mmol/L — ABNORMAL LOW (ref 3.5–5.1)
Sodium: 141 mmol/L (ref 135–145)
Total Bilirubin: 8.5 mg/dL — ABNORMAL HIGH (ref 0.3–1.2)
Total Protein: 7.4 g/dL (ref 6.5–8.1)

## 2022-08-24 LAB — CBC
HCT: 26.8 % — ABNORMAL LOW (ref 39.0–52.0)
Hemoglobin: 8.1 g/dL — ABNORMAL LOW (ref 13.0–17.0)
MCH: 22.1 pg — ABNORMAL LOW (ref 26.0–34.0)
MCHC: 30.2 g/dL (ref 30.0–36.0)
MCV: 73.2 fL — ABNORMAL LOW (ref 80.0–100.0)
Platelets: 32 10*3/uL — ABNORMAL LOW (ref 150–400)
RBC: 3.66 MIL/uL — ABNORMAL LOW (ref 4.22–5.81)
RDW: 25.2 % — ABNORMAL HIGH (ref 11.5–15.5)
WBC: 3.4 10*3/uL — ABNORMAL LOW (ref 4.0–10.5)
nRBC: 0 % (ref 0.0–0.2)

## 2022-08-24 LAB — APTT: aPTT: 36 seconds (ref 24–36)

## 2022-08-24 LAB — PROTIME-INR
INR: 1.4 — ABNORMAL HIGH (ref 0.8–1.2)
Prothrombin Time: 17 seconds — ABNORMAL HIGH (ref 11.4–15.2)

## 2022-08-24 LAB — ACETAMINOPHEN LEVEL: Acetaminophen (Tylenol), Serum: <10 ug/mL — ABNORMAL LOW (ref 10–30)

## 2022-08-24 LAB — LIPASE, BLOOD: Lipase: 64 U/L — ABNORMAL HIGH (ref 11–51)

## 2022-08-24 MED ORDER — PANTOPRAZOLE 80MG IVPB - SIMPLE MED
80.0000 mg | Freq: Once | INTRAVENOUS | Status: AC
  Administered 2022-08-24: 80 mg via INTRAVENOUS
  Filled 2022-08-24: qty 100

## 2022-08-24 MED ORDER — FOLIC ACID 1 MG PO TABS
1.0000 mg | ORAL_TABLET | Freq: Every day | ORAL | Status: DC
  Administered 2022-08-24: 1 mg via ORAL
  Filled 2022-08-24: qty 1

## 2022-08-24 MED ORDER — THIAMINE HCL 100 MG/ML IJ SOLN: 100.0000 mg | Freq: Every day | INTRAMUSCULAR | Status: DC

## 2022-08-24 MED ORDER — SODIUM CHLORIDE 0.9 % IV SOLN
50.0000 ug/h | INTRAVENOUS | Status: DC
  Administered 2022-08-24 – 2022-08-26 (×2): 50 ug/h via INTRAVENOUS
  Filled 2022-08-24 (×7): qty 1

## 2022-08-24 MED ORDER — IOHEXOL 300 MG/ML  SOLN
100.0000 mL | Freq: Once | INTRAMUSCULAR | Status: AC | PRN
  Administered 2022-08-24: 100 mL via INTRAVENOUS

## 2022-08-24 MED ORDER — THIAMINE MONONITRATE 100 MG PO TABS
100.0000 mg | ORAL_TABLET | Freq: Every day | ORAL | Status: DC
  Administered 2022-08-24: 100 mg via ORAL
  Filled 2022-08-24: qty 1

## 2022-08-24 MED ORDER — ONDANSETRON HCL 4 MG/2ML IJ SOLN
4.0000 mg | Freq: Once | INTRAMUSCULAR | Status: AC
  Administered 2022-08-24: 4 mg via INTRAVENOUS
  Filled 2022-08-24: qty 2

## 2022-08-24 MED ORDER — OXYCODONE-ACETAMINOPHEN 5-325 MG PO TABS
1.0000 | ORAL_TABLET | Freq: Once | ORAL | Status: AC
  Administered 2022-08-24: 1 via ORAL
  Filled 2022-08-24: qty 1

## 2022-08-24 MED ORDER — ACETAMINOPHEN 325 MG PO TABS: 650.0000 mg | ORAL_TABLET | Freq: Once | ORAL | Status: DC

## 2022-08-24 MED ORDER — SODIUM CHLORIDE 0.9 % IV SOLN
2.0000 g | Freq: Once | INTRAVENOUS | Status: AC
  Administered 2022-08-24: 2 g via INTRAVENOUS
  Filled 2022-08-24: qty 20

## 2022-08-24 MED ORDER — SODIUM CHLORIDE 0.9 % IV BOLUS
1000.0000 mL | Freq: Once | INTRAVENOUS | Status: AC
  Administered 2022-08-24: 1000 mL via INTRAVENOUS

## 2022-08-24 MED ORDER — OXYCODONE HCL 5 MG PO TABS
5.0000 mg | ORAL_TABLET | Freq: Once | ORAL | Status: AC
  Administered 2022-08-24: 5 mg via ORAL
  Filled 2022-08-24: qty 1

## 2022-08-24 MED ORDER — LORAZEPAM 1 MG PO TABS
1.0000 mg | ORAL_TABLET | ORAL | Status: DC | PRN
  Administered 2022-08-24: 1 mg via ORAL
  Filled 2022-08-24: qty 1
  Filled 2022-08-24: qty 2

## 2022-08-24 MED ORDER — ADULT MULTIVITAMIN W/MINERALS CH
1.0000 | ORAL_TABLET | Freq: Every day | ORAL | Status: DC
  Administered 2022-08-24: 1 via ORAL
  Filled 2022-08-24: qty 1

## 2022-08-24 NOTE — H&P (Incomplete)
[place]   PATIENT NAME: Stephen Mayer    MR#:  [id number]  DATE OF BIRTH:  April 01, 1975  DATE OF ADMISSION:  08/24/2022  PRIMARY CARE PHYSICIAN: Pcp, No   Patient is coming from: Home  REQUESTING/REFERRING PHYSICIAN: Orvil Feil, PA-C   CHIEF COMPLAINT:   Chief Complaint  Patient presents with   Abdominal Pain   Diarrhea    HISTORY OF PRESENT ILLNESS:  Stephen Mayer is a 47 y.o. male with medical history significant for alcohol use, GERD, osteonecrosis, pancreatic insufficiency as well as alcoholic liver cirrhosis, who presented to the emergency room with acute onset of right upper quadrant abdominal pain with associated nausea and hematemesis with bright red blood which have been intermittent over the last 5 days with several episodes per day.  He denies any melena or bright red blood per rectum.  No fever or chills.  No dysuria, oliguria or hematuria or flank pain.  No cough or wheezing or hemoptysis.  No other bleeding diathesis.  No chest pain or palpitations.  ED Course: When he came to the ER, BP was 153/80 with a heart rate of 109 with otherwise normal vital signs.  Labs revealed mild hypokalemia 3.4 and CO2 of 18, alk phos 168, albumin 3.3 and total protein 7.4 lipase was 64 in AST to 26 and ALT 82 with total bili of 8.5 up from 1.5 previously.  Blood group was A+ with negative antibody screen. EKG as reviewed by me : None. Imaging: 2 view chest x-ray showed no acute cardiopulmonary disease.  Right upper quadrant ultrasound showed signs of cirrhosis and it was marked limitation due to body habitus and overlying bowel gas.  The patient was given 4 mg of IV Zofran, 1 p.o. oxycodone 5 mg, 1 L bolus of IV normal saline, 100 mg of p.o. thiamine, IV Protonix bolus of 80 mg as well as IV octreotide bolus of 50 mcg.  He will be admitted to a progressive unit bed for further evaluation and management. PAST MEDICAL HISTORY:   Past Medical History:  Diagnosis Date    Arthritis   Alcohol abuse GERD Pancreatic insufficiency Alcoholic liver cirrhosis Osteonecrosis PAST SURGICAL HISTORY:  History reviewed. No pertinent surgical history. No previous surgeries. SOCIAL HISTORY:   Social History   Tobacco Use   Smoking status: Every Day    Types: Cigarettes   Smokeless tobacco: Never  Substance Use Topics   Alcohol use: Yes    FAMILY HISTORY:  Positive for diabetes mellitus in his mother and CVA in his father.  DRUG ALLERGIES:  No Known Allergies  REVIEW OF SYSTEMS:   ROS As per history of present illness. All pertinent systems were reviewed above. Constitutional, HEENT, cardiovascular, respiratory, GI, GU, musculoskeletal, neuro, psychiatric, endocrine, integumentary and hematologic systems were reviewed and are otherwise negative/unremarkable except for positive findings mentioned above in the HPI.   MEDICATIONS AT HOME:   Prior to Admission medications   Medication Sig Start Date End Date Taking? Authorizing Provider  furosemide (LASIX) 20 MG tablet Take 1 tablet (20 mg total) by mouth daily for 5 days. 06/22/22 06/27/22  Chesley Noon, MD      VITAL SIGNS:  Blood pressure 134/79, pulse 78, temperature 98.1 F (36.7 C), temperature source Oral, resp. rate 16, height 5\' 7"  (1.702 m), weight 81.6 kg, SpO2 98%.  PHYSICAL EXAMINATION:  Physical Exam  GENERAL:  47 y.o.-year-old male patient lying in the bed with no acute distress.  EYES: Pupils  equal, round, reactive to light and accommodation.  Positive scleral icterus. Extraocular muscles intact.  HEENT: Head atraumatic, normocephalic. Oropharynx and nasopharynx clear.  NECK:  Supple, no jugular venous distention. No thyroid enlargement, no tenderness.  LUNGS: Normal breath sounds bilaterally, no wheezing, rales,rhonchi or crepitation. No use of accessory muscles of respiration.  CARDIOVASCULAR: Regular rate and rhythm, S1, S2 normal. No murmurs, rubs, or gallops.  ABDOMEN: Soft,  nondistended, with minimal epigastric and right upper quadrant tenderness without rebound tenderness guarding or rigidity. Bowel sounds present. No organomegaly or mass.  EXTREMITIES: 1+ bilateral lower extremity pitting pedal edema, with no cyanosis, or clubbing.  NEUROLOGIC: Cranial nerves II through XII are intact. Muscle strength 5/5 in all extremities. Sensation intact. Gait not checked.  PSYCHIATRIC: The patient is alert and oriented x 3.  Normal affect and good eye contact. SKIN: No obvious rash, lesion, or ulcer.   LABORATORY PANEL:   CBC Recent Labs  Lab 08/24/22 1348  WBC 3.4*  HGB 8.1*  HCT 26.8*  PLT 32*   ------------------------------------------------------------------------------------------------------------------  Chemistries  Recent Labs  Lab 08/24/22 1348  NA 141  K 3.4*  CL 110  CO2 18*  GLUCOSE 101*  BUN 10  CREATININE 0.78  CALCIUM 8.1*  AST 226*  ALT 82*  ALKPHOS 168*  BILITOT 8.5*   ------------------------------------------------------------------------------------------------------------------  Cardiac Enzymes No results for input(s): "TROPONINI" in the last 168 hours. ------------------------------------------------------------------------------------------------------------------  RADIOLOGY:  CT ABDOMEN PELVIS W CONTRAST  Result Date: 08/24/2022 CLINICAL DATA:  Abdominal pain, acute, nonlocalized EXAM: CT ABDOMEN AND PELVIS WITH CONTRAST TECHNIQUE: Multidetector CT imaging of the abdomen and pelvis was performed using the standard protocol following bolus administration of intravenous contrast. RADIATION DOSE REDUCTION: This exam was performed according to the departmental dose-optimization program which includes automated exposure control, adjustment of the mA and/or kV according to patient size and/or use of iterative reconstruction technique. CONTRAST:  OMNIPAQUE IOHEXOL 300 MG/ML  SOLN COMPARISON:  CT dated November 16, 2005. FINDINGS:  Lower chest: No acute abnormality. Hepatobiliary: Hepatic steatosis. Nodular liver contour. Recanalized paraumbilical vein. Main portal vein is grossly patent with multiple adjacent collaterals in the porta hepatis as well as additional portosystemic shunts. Multiple areas of stellate appearing enhancement likely reflecting fibrosis. Pancreas: Multiple coarse calcifications of the pancreas consistent with sequela of chronic pancreatitis. No peripancreatic fat stranding. Spleen: Splenomegaly. Multiple distended adjacent splenic collateral vessels. Adrenals/Urinary Tract: Adrenal glands are unremarkable. Multiple nonobstructing bilateral nephrolithiasis. No obstructing nephrolithiasis. No hydronephrosis. Evaluation of the ureterovesicular junction is limited due to streak artifact from hip arthroplasty. Bladder is unremarkable. Stomach/Bowel: No evidence of bowel obstruction. Moderate hiatal hernia. Prominence of the stomach folds. Appendix is normal. Vascular/Lymphatic: Abdominal aorta is normal in caliber. No visualized lymphadenopathy. Retroaortic LEFT renal vein. Reproductive: Prostate is obscured by streak artifact. Other: No free air or free fluid. Musculoskeletal: Status post bilateral hip arthroplasty. IMPRESSION: 1. Cirrhosis with sequela of portal hypertension including splenomegaly and multiple portosystemic shunts. 2. Prominence of the stomach folds. This could reflect gastritis in the appropriate clinical setting. 3. Moderate hiatal hernia. 4. Nonobstructing bilateral nephrolithiasis. 5. Sequela of chronic pancreatitis. Electronically Signed   By: Meda Klinefelter M.D.   On: 08/24/2022 20:08   DG Chest 2 View  Result Date: 08/24/2022 CLINICAL DATA:  upper GI bleed EXAM: CHEST - 2 VIEW COMPARISON:  None Available. FINDINGS: The cardiomediastinal silhouette is normal in contour. No pleural effusion. No pneumothorax. No acute pleuroparenchymal abnormality. Visualized abdomen is unremarkable. No acute  osseous abnormality noted.  IMPRESSION: No acute cardiopulmonary abnormality. Electronically Signed   By: Meda Klinefelter M.D.   On: 08/24/2022 19:24   US ABDOMEN LIMITED RUQ (LIVER/GB)  Result Date: 08/24/2022 CLINICAL DATA:  Pain EXAM: ULTRASOUND ABDOMEN LIMITED RIGHT UPPER QUADRANT COMPARISON:  None Available. FINDINGS: Gallbladder: No gallstones or wall thickening visualized. No sonographic Murphy sign noted by sonographer. Common bile duct: Not able to visualized to overlying bowel gas. Liver: Evaluation is limited to decreased penetration, Underlying mass can not be excluded given exam limitations. Nodular and coarse echotexture parenchymal echogenicity. Portal vein is not visualized due to overlying bowel bowel-gas. Other: None. IMPRESSION: 1. Markedly limited exam due to patient body habitus and overlying bowel gas. 2. Nodular and coarse echotexture of the liver parenchyma, suggestive of cirrhosis. Underlying mass can not be excluded given exam limitations. 3. Bile ducts and portal vein are not visualized due to exam limitations. 4. Visualized gallbladder is unremarkable. Electronically Signed   By: Allegra Lai M.D.   On: 08/24/2022 19:01      IMPRESSION AND PLAN:  Assessment and Plan: * GI bleeding - The patient will be admitted to a unit bed. - We will continue him on IV Protonix and octreotide drips. - GI consult will be obtained. - I notified Dr. Tobi Bastos about the patient. - We will keep him n.p.o. except for medications. - He will be hydrated with IV normal saline. - We will repeat abdominal ultrasound especially to rule out portal vein thrombosis. - Acute hepatitis panel was ordered and is currently pending.  Elevated LFTs - This could be related to alcoholic hepatitis and liver cirrhosis.  They are worse than previous levels last year in January.  Hypokalemia - Potassium will be replaced.  Metabolic acidosis - This could be related to alcohol abuse. - We will continue  sodium bicarbonate.  Alcohol abuse - He will be placed on CIWA protocol for possible early withdrawal.  Chronic pain - We will resume his pain medications.  Pancreatic insufficiency - This could be related to alcoholic chronic pancreatitis. - We will continue his Creon.    DVT prophylaxis: SCDs. Advanced Care Planning:  Code Status: full code. Family Communication:  The plan of care was discussed in details with the patient (and family). I answered all questions. The patient agreed to proceed with the above mentioned plan. Further management will depend upon hospital course. Disposition Plan: Back to previous home environment Consults called: GI consult All the records are reviewed and case discussed with ED provider.  Status is: Inpatient  At the time of the admission, it appears that the appropriate admission status for this patient is inpatient.  This is judged to be reasonable and necessary in order to provide the required intensity of service to ensure the patient's safety given the presenting symptoms, physical exam findings and initial radiographic and laboratory data in the context of comorbid conditions.  The patient requires inpatient status due to high intensity of service, high risk of further deterioration and high frequency of surveillance required.  I certify that at the time of admission, it is my clinical judgment that the patient will require inpatient hospital care extending more than 2 midnights.                            Dispo: The patient is from: Home              Anticipated d/c is to: Home  Patient currently is not medically stable to d/c.              Difficult to place patient: No  Hannah Beat M.D on 08/25/2022 at 5:20 AM  [facility]   From 7 PM-7 AM, contact night-coverage [email or web address]  CC: Primary care physician; Pcp, No

## 2022-08-24 NOTE — ED Notes (Signed)
Vital signs do not cross over from monitor.

## 2022-08-24 NOTE — ED Provider Notes (Signed)
Assumed patient care from Pilar Jarvis, MD.  Right upper quadrant ultrasound returned and was unable to visualize bile ducts and portal vein.  CT abdomen pelvis consistent with cirrhosis but no other acute abnormality.  PT/INR slightly elevated.  Hepatitis panel in process.  I did review patient's labs with GI on-call physician, Dr. Tobi Bastos who recommended admission and Doppler to rule out portal vein thrombosis.  Patient will be made n.p.o. after midnight to have ultrasound in the morning.  Patient was given octreotide and accepted to the hospitalist service under the care of Dr. Arville Care.    Pia Mau [place], Cordelia Poche 08/24/22 2102    Pilar Jarvis, MD 08/25/22 [phone]

## 2022-08-24 NOTE — ED Triage Notes (Signed)
Pt via POV from home. Pt c/o abd pain, hematemesis, and diarrhea for the past couple of days. Pt has an extensive GI/GU hx including CKD, pancreatis, and liver failure. Pt is A&Ox4 and NAD, ambulatory to triage.

## 2022-08-24 NOTE — ED Notes (Signed)
Report given to Vanessa RN.

## 2022-08-24 NOTE — ED Notes (Signed)
Patient ambulated to hallway bathroom with a steady gait. 

## 2022-08-24 NOTE — ED Provider Notes (Addendum)
[facility] Provider Note    Event Date/Time   First MD Initiated Contact with Patient 08/24/22 1355     (approximate)   History   Abdominal Pain and Diarrhea   HPI  Stephen Mayer is a 47 y.o. male   Past medical history of alcohol use, liver disease, pancreatitis who presents to the emergency department with upper abdominal discomfort nausea and vomiting and hematemesis for the last 3 to 4 days.  He continues to drink alcohol daily and had his last drink just before coming to the emergency department.  He is also had diarrhea which he states is watery and yellow but denies blood or melena.  He denies any urinary complaints.  He does not take blood thinners.  He used to be a resident of , no established care here in [place] yet.  He has had prior endoscopy but unsure of results or whether or not he has had esophageal varices diagnosed in the past.        Independent Historian contributed to assessment above: At bedside to corroborate information past medical history as above  External Medical Documents Reviewed: Prior lab testing that showed mild elevated LFTs 2023      Physical Exam   Triage Vital Signs: ED Triage Vitals  Enc Vitals Group     BP 08/24/22 1346 (!) 153/80     Pulse Rate 08/24/22 1346 (!) 109     Resp 08/24/22 1346 18     Temp 08/24/22 1346 98.1 F (36.7 C)     Temp Source 08/24/22 1346 Oral     SpO2 08/24/22 1346 98 %     Weight 08/24/22 1344 180 lb (81.6 kg)     Height 08/24/22 1344 5\' 7"  (1.702 m)     Head Circumference --      Peak Flow --      Pain Score 08/24/22 1344 0     Pain Loc --      Pain Edu? --      Excl. in GC? --     Most recent vital signs: Vitals:   08/24/22 1621 08/24/22 1706  BP: (!) 146/89 133/81  Pulse: 82 86  Resp:  16  Temp:    SpO2:  99%    General: Awake, no distress.  CV:  Good peripheral perfusion.  Resp:  Normal effort.  Abd:  No distention.   Other:  Comfortable appearing man no acute distress initially tachycardic but resolved without treatment, hypertensive, soft nontender abdomen to deep palpation all quadrants.  He declined a rectal exam.   ED Results / Procedures / Treatments   Labs (all labs ordered are listed, but only abnormal results are displayed) Labs Reviewed  LIPASE, BLOOD - Abnormal; Notable for the following components:      Result Value   Lipase 64 (*)    All other components within normal limits  COMPREHENSIVE METABOLIC PANEL - Abnormal; Notable for the following components:   Potassium 3.4 (*)    CO2 18 (*)    Glucose, Bld 101 (*)    Calcium 8.1 (*)    Albumin 3.3 (*)    AST 226 (*)    ALT 82 (*)    Alkaline Phosphatase 168 (*)    Total Bilirubin 8.5 (*)    All other components within normal limits  CBC - Abnormal; Notable for the following components:   WBC 3.4 (*)    RBC 3.66 (*)    Hemoglobin 8.1 (*)  HCT 26.8 (*)    MCV 73.2 (*)    MCH 22.1 (*)    RDW 25.2 (*)    Platelets 32 (*)    All other components within normal limits  TYPE AND SCREEN     I ordered and reviewed the above labs they are notable for downtrending hemoglobin 8 from 9 from 10 in recent testing.  He has a 2 1 ratio of his AST to ALT markedly elevated bilirubin, pancytopenia (chronic worsening)   Radiology- I independently interpreted the upper quadrant ultrasound and see no obvious signs of cholecystitis   PROCEDURES:  Critical Care performed: No  Procedures   MEDICATIONS ORDERED IN ED: Medications  acetaminophen (TYLENOL) tablet 650 mg (650 mg Oral Patient Refused/Not Given 08/24/22 1616)  LORazepam (ATIVAN) tablet 1-4 mg (has no administration in time range)  thiamine (VITAMIN B1) tablet 100 mg (100 mg Oral Given 08/24/22 1615)    Or  thiamine (VITAMIN B1) injection 100 mg ( Intravenous See Alternative 08/24/22 1615)  folic acid (FOLVITE) tablet 1 mg (1 mg Oral Given 08/24/22 1615)  multivitamin with minerals  tablet 1 tablet (1 tablet Oral Given 08/24/22 1615)  sodium chloride 0.9 % bolus 1,000 mL (0 mLs Intravenous Stopped 08/24/22 1705)  ondansetron (ZOFRAN) injection 4 mg (4 mg Intravenous Given 08/24/22 1506)  pantoprazole (PROTONIX) 80 mg /NS 100 mL IVPB (0 mg Intravenous Stopped 08/24/22 1528)  cefTRIAXone (ROCEPHIN) 2 g in sodium chloride 0.9 % 100 mL IVPB (0 g Intravenous Stopped 08/24/22 1705)  oxyCODONE (Oxy IR/ROXICODONE) immediate release tablet 5 mg (5 mg Oral Given 08/24/22 1615)    External physician / consultants:  I spoke with Hospitalist for admit and regarding care plan for this patient.   IMPRESSION / MDM / ASSESSMENT AND PLAN / ED COURSE  I reviewed the triage vital signs and the nursing notes.                                Patient's presentation is most consistent with acute presentation with potential threat to life or bodily function.  Differential diagnosis includes, but is not limited to, upper GI bleeding, pancreatitis, acute blood loss anemia, gastroenteritis, liver failure   The patient is on the cardiac monitor to evaluate for evidence of arrhythmia and/or significant heart rate changes.  MDM: This patient with liver disease in the setting of alcohol use history of pancreatitis with upper abdominal pain nausea vomiting diarrhea for last several days most concerning with reports of hematemesis.  Unknown variceal history.  No active bleeding or recurrence of hematemesis in the emergency department.  Initial tachycardic but normalized without treatment.  Soft nontender abdomen with elevated LFTs and bilirubin will obtain right upper quadrant ultrasound to assess for surgical pathologies like cholelithiasis/cholecystitis..  I advised rectal exam to check for evidence of melena but patient adamantly declines.   Ultimately with his high risk factors including alcohol use liver disease downtrending hemoglobin and hematemesis I think he should be admitted for repeat H&H,  monitoring, GI consultation as needed.  He is stable currently and I have ordered for IV crystalloid bolus, IV Zofran, IV Protonix.  IV ceftriaxone given unknown variceal history and GI bleeding upper On CIWA for history of alcohol use no signs of withdrawal currently  Ultrasound performed pending formal read.  He will be admitted to hospitalist service barring any surgical pathologies found on ultrasound.  Remains stable comfortable w no further vomiting  in the emergency department on reassessment       FINAL CLINICAL IMPRESSION(S) / ED DIAGNOSES   Final diagnoses:  Nausea vomiting and diarrhea  Hematemesis with nausea  Pancytopenia (HCC)     Rx / DC Orders   ED Discharge Orders     None        Note:  This document was prepared using Dragon voice recognition software and may include unintentional dictation errors.    Pilar Jarvis, MD 08/24/22 1523    Pilar Jarvis, MD 08/24/22 Kristeen Mans    Pilar Jarvis, MD 08/24/22 Luiz Iron    Pilar Jarvis, MD 08/24/22 1900

## 2022-08-25 ENCOUNTER — Inpatient Hospital Stay: Payer: BLUE CROSS/BLUE SHIELD

## 2022-08-25 ENCOUNTER — Encounter: Payer: Self-pay | Admitting: Family Medicine

## 2022-08-25 DIAGNOSIS — K920 Hematemesis: Secondary | ICD-10-CM

## 2022-08-25 DIAGNOSIS — D61818 Other pancytopenia: Secondary | ICD-10-CM

## 2022-08-25 DIAGNOSIS — E876 Hypokalemia: Secondary | ICD-10-CM

## 2022-08-25 DIAGNOSIS — E872 Acidosis, unspecified: Secondary | ICD-10-CM

## 2022-08-25 DIAGNOSIS — K922 Gastrointestinal hemorrhage, unspecified: Secondary | ICD-10-CM | POA: Diagnosis not present

## 2022-08-25 DIAGNOSIS — K7030 Alcoholic cirrhosis of liver without ascites: Secondary | ICD-10-CM | POA: Diagnosis not present

## 2022-08-25 DIAGNOSIS — F1010 Alcohol abuse, uncomplicated: Secondary | ICD-10-CM

## 2022-08-25 DIAGNOSIS — K8689 Other specified diseases of pancreas: Secondary | ICD-10-CM | POA: Insufficient documentation

## 2022-08-25 DIAGNOSIS — G8929 Other chronic pain: Secondary | ICD-10-CM | POA: Insufficient documentation

## 2022-08-25 DIAGNOSIS — R7989 Other specified abnormal findings of blood chemistry: Secondary | ICD-10-CM

## 2022-08-25 HISTORY — DX: Hematemesis: K92.0

## 2022-08-25 LAB — LACTATE DEHYDROGENASE: LDH: 273 U/L — ABNORMAL HIGH (ref 98–192)

## 2022-08-25 LAB — CBC WITH DIFFERENTIAL/PLATELET
Abs Immature Granulocytes: 0.01 10*3/uL (ref 0.00–0.07)
Basophils Absolute: 0.1 10*3/uL (ref 0.0–0.1)
Basophils Relative: 3 %
Eosinophils Absolute: 0 10*3/uL (ref 0.0–0.5)
Eosinophils Relative: 2 %
HCT: 25.1 % — ABNORMAL LOW (ref 39.0–52.0)
Hemoglobin: 7.4 g/dL — ABNORMAL LOW (ref 13.0–17.0)
Immature Granulocytes: 1 %
Lymphocytes Relative: 19 %
Lymphs Abs: 0.4 10*3/uL — ABNORMAL LOW (ref 0.7–4.0)
MCH: 22.1 pg — ABNORMAL LOW (ref 26.0–34.0)
MCHC: 29.5 g/dL — ABNORMAL LOW (ref 30.0–36.0)
MCV: 74.9 fL — ABNORMAL LOW (ref 80.0–100.0)
Monocytes Absolute: 0.3 10*3/uL (ref 0.1–1.0)
Monocytes Relative: 14 %
Neutro Abs: 1.3 10*3/uL — ABNORMAL LOW (ref 1.7–7.7)
Neutrophils Relative %: 61 %
Platelets: 19 10*3/uL — CL (ref 150–400)
RBC: 3.35 MIL/uL — ABNORMAL LOW (ref 4.22–5.81)
RDW: 24.2 % — ABNORMAL HIGH (ref 11.5–15.5)
Smear Review: DECREASED
WBC: 2 10*3/uL — ABNORMAL LOW (ref 4.0–10.5)
nRBC: 0 % (ref 0.0–0.2)

## 2022-08-25 LAB — IRON AND TIBC
Iron: 55 ug/dL (ref 45–182)
Saturation Ratios: 15 % — ABNORMAL LOW (ref 17.9–39.5)
TIBC: 363 ug/dL (ref 250–450)
UIBC: 308 ug/dL

## 2022-08-25 LAB — PREPARE RBC (CROSSMATCH)

## 2022-08-25 LAB — FOLATE: Folate: 29 ng/mL (ref >5.9)

## 2022-08-25 LAB — ABO/RH: ABO/RH(D): A POS

## 2022-08-25 LAB — FERRITIN: Ferritin: 37 ng/mL (ref 24–336)

## 2022-08-25 LAB — DIRECT ANTIGLOBULIN TEST (NOT AT ARMC)
DAT, IgG: NEGATIVE
DAT, complement: NEGATIVE

## 2022-08-25 MED ORDER — LORAZEPAM 2 MG/ML IJ SOLN: 1.0000 mg | INTRAMUSCULAR | Status: DC | PRN

## 2022-08-25 MED ORDER — PANTOPRAZOLE SODIUM 40 MG IV SOLR
40.0000 mg | Freq: Two times a day (BID) | INTRAVENOUS | Status: DC
  Administered 2022-08-25 – 2022-08-27 (×5): 40 mg via INTRAVENOUS
  Filled 2022-08-25 (×5): qty 10

## 2022-08-25 MED ORDER — LORAZEPAM 1 MG PO TABS
1.0000 mg | ORAL_TABLET | ORAL | Status: DC | PRN
  Administered 2022-08-25 (×2): 2 mg via ORAL
  Administered 2022-08-26 – 2022-08-27 (×3): 1 mg via ORAL
  Filled 2022-08-25: qty 2
  Filled 2022-08-25 (×3): qty 1

## 2022-08-25 MED ORDER — THIAMINE MONONITRATE 100 MG PO TABS
100.0000 mg | ORAL_TABLET | Freq: Every day | ORAL | Status: DC
  Administered 2022-08-25 – 2022-08-26 (×2): 100 mg via ORAL
  Filled 2022-08-25 (×3): qty 1

## 2022-08-25 MED ORDER — FOLIC ACID 1 MG PO TABS
1.0000 mg | ORAL_TABLET | Freq: Every day | ORAL | Status: DC
  Administered 2022-08-25 – 2022-08-27 (×3): 1 mg via ORAL
  Filled 2022-08-25 (×3): qty 1

## 2022-08-25 MED ORDER — MORPHINE SULFATE (PF) 2 MG/ML IV SOLN: 2.0000 mg | INTRAVENOUS | Status: DC | PRN

## 2022-08-25 MED ORDER — OXYCODONE-ACETAMINOPHEN 5-325 MG PO TABS
1.0000 | ORAL_TABLET | ORAL | Status: DC | PRN
  Administered 2022-08-25 – 2022-08-26 (×3): 1 via ORAL
  Filled 2022-08-25 (×3): qty 1

## 2022-08-25 MED ORDER — ADULT MULTIVITAMIN W/MINERALS CH
1.0000 | ORAL_TABLET | Freq: Every day | ORAL | Status: DC
  Administered 2022-08-25 – 2022-08-27 (×3): 1 via ORAL
  Filled 2022-08-25 (×3): qty 1

## 2022-08-25 MED ORDER — SODIUM CHLORIDE 0.9% IV SOLUTION
Freq: Once | INTRAVENOUS | Status: AC
  Filled 2022-08-25: qty 250

## 2022-08-25 MED ORDER — SODIUM CHLORIDE 0.9% IV SOLUTION
Freq: Once | INTRAVENOUS | Status: DC
  Filled 2022-08-25: qty 250

## 2022-08-25 MED ORDER — PANTOPRAZOLE 80MG IVPB - SIMPLE MED
80.0000 mg | Freq: Once | INTRAVENOUS | Status: AC
  Administered 2022-08-25: 80 mg via INTRAVENOUS
  Filled 2022-08-25: qty 100

## 2022-08-25 MED ORDER — THIAMINE HCL 100 MG/ML IJ SOLN
100.0000 mg | Freq: Every day | INTRAMUSCULAR | Status: DC
  Administered 2022-08-27: 100 mg via INTRAVENOUS
  Filled 2022-08-25: qty 2

## 2022-08-25 MED ORDER — PREDNISONE 50 MG PO TABS
50.0000 mg | ORAL_TABLET | Freq: Every day | ORAL | Status: DC
  Administered 2022-08-25 – 2022-08-27 (×3): 50 mg via ORAL
  Filled 2022-08-25 (×2): qty 1
  Filled 2022-08-25: qty 2
  Filled 2022-08-25: qty 1

## 2022-08-25 NOTE — Assessment & Plan Note (Signed)
-   He will be placed on CIWA protocol for possible early withdrawal.

## 2022-08-25 NOTE — Assessment & Plan Note (Signed)
-   We will resume his pain medications.

## 2022-08-25 NOTE — Assessment & Plan Note (Signed)
repleted   Serial BMP 

## 2022-08-25 NOTE — Assessment & Plan Note (Signed)
-   This could be related to alcoholic hepatitis and liver cirrhosis.  They are worse than previous levels last year in January.

## 2022-08-25 NOTE — Progress Notes (Signed)
Spoke with pt regarding hgb and plt drifting  down. Consent obtained for transfusion of plt and PRBC over the phone.

## 2022-08-25 NOTE — Assessment & Plan Note (Signed)
-   This could be related to alcohol abuse. - We will continue sodium bicarbonate.

## 2022-08-25 NOTE — Consult Note (Signed)
Stephen Mayer , MD [address], [place], [place], [address] [address], [place], [place], [address] Phone: [phone]  Fax: [phone]  Consultation  Referring Provider:    Dr Arville Care Primary Care Physician:  Pcp, No Primary Gastroenterologist:  None         Reason for Consultation:     GI bleed  Date of Admission:  08/24/2022 Date of Consultation:  08/25/2022         HPI:   Stephen Mayer is a 47 y.o. male with a history of longstanding alcohol consumption pancreatic insufficiency presented emergency room with right upper quadrant pain nausea vomiting hematemesis for the past 5 days.  He is states that he has been drinking a lot of alcohol and he has been gagging and when he gags initially he has not noticed any blood but subsequently noticed some blood in his vomitus.  The last episode was yesterday morning denies any since then.  Denies any abdominal pain.  His stool has been yellow-green brown denies any black or red stool.  Denies any NSAID use.  Denies any use of blood thinners.  He is trying to quit alcohol.   1 year back his hemoglobin is 10 g on admission of 8.1 g and 2 months back was 9.2 g MCV 73.2.  Potassium 3.4 creatinine 0.78 albumin 3.3 AST 226 ALT 82 alkaline phosphatase 168 total bilirubin 8.5.   Lipase 64 INR 1.4 BNP 106 Right upper quadrant ultrasound showed nodular features of the liver suggestive of cirrhosis underlying mass cannot be excluded CT abdomen pelvis with contrast showed cirrhosis with features of portal hypertension moderate hiatal hernia sequelae of chronic pancreatitis Tylenol level normal. Past Medical History:  Diagnosis Date   Arthritis     History reviewed. No pertinent surgical history.  Prior to Admission medications   Medication Sig Start Date End Date Taking? Authorizing Provider  Omeprazole Magnesium (PRILOSEC OTC PO) Take 20 mg by mouth daily.   Yes [provider]  furosemide (LASIX) 20 MG tablet Take  1 tablet (20 mg total) by mouth daily for 5 days. 06/22/22 06/27/22  Chesley Noon, MD    History reviewed. No pertinent family history.   Social History   Tobacco Use   Smoking status: Every Day    Types: Cigarettes   Smokeless tobacco: Never  Substance Use Topics   Alcohol use: Yes   Drug use: Yes    Allergies as of 08/24/2022   (No Known Allergies)    Review of Systems:    All systems reviewed and negative except where noted in HPI.   Physical Exam:  Vital signs in last 24 hours: Temp:  [98.1 F (36.7 C)] 98.1 F (36.7 C) (07/10 1346) Pulse Rate:  [71-109] 71 (07/11 0500) Resp:  [16-18] 16 (07/10 1706) BP: (123-153)/(65-89) 128/84 (07/11 0500) SpO2:  [92 %-99 %] 92 % (07/11 0500) Weight:  [81.6 kg] 81.6 kg (07/10 1344)   General:   Pleasant, cooperative in NAD fine tremors on extended hands Head:  Normocephalic and atraumatic. Eyes:   No icterus.   Conjunctiva pink. PERRLA. Ears:  Normal auditory acuity. Neck:  Supple; no masses or thyroidomegaly Lungs: Respirations even and unlabored. Lungs clear to auscultation bilaterally.   No wheezes, crackles, or rhonchi.  Heart:  Regular rate and rhythm;  Without murmur, clicks, rubs or gallops Abdomen:  Soft, nondistended, nontender. Normal bowel sounds. No appreciable masses or hepatomegaly.  No rebound or guarding.  Neurologic:  Alert and oriented x3;  grossly normal neurologically. Skin:  Intact without significant lesions or rashes.  Tattoos noted Cervical Nodes:  No significant cervical adenopathy. Psych:  Alert and cooperative. Normal affect.  LAB RESULTS: Recent Labs    08/24/22 1348  WBC 3.4*  HGB 8.1*  HCT 26.8*  PLT 32*   BMET Recent Labs    08/24/22 1348  NA 141  K 3.4*  CL 110  CO2 18*  GLUCOSE 101*  BUN 10  CREATININE 0.78  CALCIUM 8.1*   LFT Recent Labs    08/24/22 1348  PROT 7.4  ALBUMIN 3.3*  AST 226*  ALT 82*  ALKPHOS 168*  BILITOT 8.5*   PT/INR Recent Labs    08/24/22 1935   LABPROT 17.0*  INR 1.4*    STUDIES: CT ABDOMEN PELVIS W CONTRAST  Result Date: 08/24/2022 CLINICAL DATA:  Abdominal pain, acute, nonlocalized EXAM: CT ABDOMEN AND PELVIS WITH CONTRAST TECHNIQUE: Multidetector CT imaging of the abdomen and pelvis was performed using the standard protocol following bolus administration of intravenous contrast. RADIATION DOSE REDUCTION: This exam was performed according to the departmental dose-optimization program which includes automated exposure control, adjustment of the mA and/or kV according to patient size and/or use of iterative reconstruction technique. CONTRAST:  OMNIPAQUE IOHEXOL 300 MG/ML  SOLN COMPARISON:  CT dated November 16, 2005. FINDINGS: Lower chest: No acute abnormality. Hepatobiliary: Hepatic steatosis. Nodular liver contour. Recanalized paraumbilical vein. Main portal vein is grossly patent with multiple adjacent collaterals in the porta hepatis as well as additional portosystemic shunts. Multiple areas of stellate appearing enhancement likely reflecting fibrosis. Pancreas: Multiple coarse calcifications of the pancreas consistent with sequela of chronic pancreatitis. No peripancreatic fat stranding. Spleen: Splenomegaly. Multiple distended adjacent splenic collateral vessels. Adrenals/Urinary Tract: Adrenal glands are unremarkable. Multiple nonobstructing bilateral nephrolithiasis. No obstructing nephrolithiasis. No hydronephrosis. Evaluation of the ureterovesicular junction is limited due to streak artifact from hip arthroplasty. Bladder is unremarkable. Stomach/Bowel: No evidence of bowel obstruction. Moderate hiatal hernia. Prominence of the stomach folds. Appendix is normal. Vascular/Lymphatic: Abdominal aorta is normal in caliber. No visualized lymphadenopathy. Retroaortic LEFT renal vein. Reproductive: Prostate is obscured by streak artifact. Other: No free air or free fluid. Musculoskeletal: Status post bilateral hip arthroplasty. IMPRESSION:  1. Cirrhosis with sequela of portal hypertension including splenomegaly and multiple portosystemic shunts. 2. Prominence of the stomach folds. This could reflect gastritis in the appropriate clinical setting. 3. Moderate hiatal hernia. 4. Nonobstructing bilateral nephrolithiasis. 5. Sequela of chronic pancreatitis. Electronically Signed   By: Meda Klinefelter M.D.   On: 08/24/2022 20:08   DG Chest 2 View  Result Date: 08/24/2022 CLINICAL DATA:  upper GI bleed EXAM: CHEST - 2 VIEW COMPARISON:  None Available. FINDINGS: The cardiomediastinal silhouette is normal in contour. No pleural effusion. No pneumothorax. No acute pleuroparenchymal abnormality. Visualized abdomen is unremarkable. No acute osseous abnormality noted. IMPRESSION: No acute cardiopulmonary abnormality. Electronically Signed   By: Meda Klinefelter M.D.   On: 08/24/2022 19:24   US ABDOMEN LIMITED RUQ (LIVER/GB)  Result Date: 08/24/2022 CLINICAL DATA:  Pain EXAM: ULTRASOUND ABDOMEN LIMITED RIGHT UPPER QUADRANT COMPARISON:  None Available. FINDINGS: Gallbladder: No gallstones or wall thickening visualized. No sonographic Murphy sign noted by sonographer. Common bile duct: Not able to visualized to overlying bowel gas. Liver: Evaluation is limited to decreased penetration, Underlying mass can not be excluded given exam limitations. Nodular and coarse echotexture parenchymal echogenicity. Portal vein is not visualized due to overlying bowel bowel-gas. Other: None. IMPRESSION: 1. Markedly  limited exam due to patient body habitus and overlying bowel gas. 2. Nodular and coarse echotexture of the liver parenchyma, suggestive of cirrhosis. Underlying mass can not be excluded given exam limitations. 3. Bile ducts and portal vein are not visualized due to exam limitations. 4. Visualized gallbladder is unremarkable. Electronically Signed   By: Allegra Lai M.D.   On: 08/24/2022 19:01      Impression / Plan:   Stephen Mayer is a 78 y.o. y/o  male with a history of excess alcohol consumption presented with right upper quadrant pain nausea vomiting hematemesis last episode was yesterday morning over 24 hours back.  It appears his episodes of hematemesis were triggered by episodes of gagging.  His stools have been brown or green in color denies any melena or hematochezia.  I do not believe he is actively bleeding.  He has pancytopenia all cell lines are down concern for bone marrow  he has cirrhosis seen on sound the liver microcytic anemia and a platelet count of 32.  Baseline platelet count in the 130s.  He very likely has an upper GI bleed which could be related to his liver cirrhosis.  Liver function tests are abnormal likely due to secondary effects of alcohol.  Plan 1.  Monitor CBC transfuse as needed 2.  IV PPI, octreotide, antibiotics 3.  Platelet count is very low at 32 would recommend further evaluation to rule out ITP, we cannot perform endoscopy evaluation till the platelet count is at least 50. 4.  He will need to stop drinking all alcohol 5.  Follow-up on ultrasound Doppler to rule out portal vein thrombosis 6.  EGD when platelet count is over 50, if iron studies show features of iron deficiency and will also need a colonoscopy 7.  His diarrhea could be related to pancreatic insufficiency related to chronic pancreatitis once he can take orally we can start him on pancreatic enzyme supplement 70,000 units before each meal and 35,000 units before each snack of pancreatic lipase. 8.  If he is having new onset diarrhea recommend stool studies. 9.  Follow-up acute hepatitis panel for elevated LFTs.  He will also require further evaluation and follow-up as an outpatient.    Thank you for involving me in the care of this patient.      LOS: 1 day   Stephen Mood, MD  08/25/2022, 8:07 AM

## 2022-08-25 NOTE — Consult Note (Signed)
[facility]  [phone] Fax:([phone]) [phone]  ID: Durene Romans OB: 10/08/1975  MR#: [id number]  XBM#:[id number]  Patient Care Team: Pcp, No as PCP - General  CHIEF COMPLAINT: Pancytopenia.  INTERVAL HISTORY: Patient is a 47 year old male with a past medical history significant for alcohol abuse and alcoholic cirrhosis.  He recently presented to the emergency room with right upper abdominal quadrant pain associated with nausea and hematemesis.  He otherwise felt well.  He has no neurologic complaints.  He denies any recent fevers or illnesses.  He has a fair appetite, but denies weight loss.  He has no chest pain, shortness of breath, cough, or hemoptysis.  He denies any vomiting, diarrhea, or constipation.  He has no melena or hematochezia.  He has no urinary complaints.  Patient offers no further specific complaints today.  REVIEW OF SYSTEMS:   Review of Systems  Constitutional: Negative.  Negative for fever, malaise/fatigue and weight loss.  Respiratory: Negative.  Negative for cough, hemoptysis and shortness of breath.   Cardiovascular: Negative.  Negative for chest pain and leg swelling.  Gastrointestinal:  Positive for abdominal pain. Negative for blood in stool and melena.       Hematemesis.  Genitourinary: Negative.  Negative for hematuria.  Musculoskeletal: Negative.  Negative for back pain.  Skin: Negative.   Neurological: Negative.  Negative for dizziness, focal weakness, weakness and headaches.  Psychiatric/Behavioral: Negative.  The patient is not nervous/anxious.     As per HPI. Otherwise, a complete review of systems is negative.  PAST MEDICAL HISTORY: Past Medical History:  Diagnosis Date   Arthritis     PAST SURGICAL HISTORY: History reviewed. No pertinent surgical history.  FAMILY HISTORY: History reviewed. No pertinent family history.  ADVANCED DIRECTIVES (Y/N):  @ADVDIR @  HEALTH MAINTENANCE: Social History   Tobacco  Use   Smoking status: Every Day    Types: Cigarettes   Smokeless tobacco: Never  Substance Use Topics   Alcohol use: Yes   Drug use: Yes     Colonoscopy:  PAP:  Bone density:  Lipid panel:  No Known Allergies  Current Facility-Administered Medications  Medication Dose Route Frequency Provider Last Rate Last Admin   0.9 %  sodium chloride infusion (Manually program via Guardrails IV Fluids)   Intravenous Once Enedina Finner, MD       acetaminophen (TYLENOL) tablet 650 mg  650 mg Oral Once Pilar Jarvis, MD       folic acid (FOLVITE) tablet 1 mg  1 mg Oral Daily Mansy, Jan A, MD   1 mg at 08/25/22 1353   LORazepam (ATIVAN) tablet 1-4 mg  1-4 mg Oral Q1H PRN Mansy, Jan A, MD   2 mg at 08/25/22 1353   Or   LORazepam (ATIVAN) injection 1-4 mg  1-4 mg Intravenous Q1H PRN Mansy, Jan A, MD       LORazepam (ATIVAN) tablet 1-4 mg  1-4 mg Oral Q1H PRN Pilar Jarvis, MD   1 mg at 08/24/22 2255   multivitamin with minerals tablet 1 tablet  1 tablet Oral Daily Mansy, Jan A, MD   1 tablet at 08/25/22 1354   octreotide (SANDOSTATIN) 500 mcg in sodium chloride 0.9 % 250 mL (2 mcg/mL) infusion  50 mcg/hr Intravenous Continuous Pia Mau M, PA-C 25 mL/hr at 08/24/22 2202 50 mcg/hr at 08/24/22 2202   pantoprazole (PROTONIX) injection 40 mg  40 mg Intravenous Q12H Enedina Finner, MD   40 mg at 08/25/22 1354   predniSONE (DELTASONE)  tablet 50 mg  50 mg Oral Q breakfast Jeralyn Ruths, MD   50 mg at 08/25/22 1354   thiamine (VITAMIN B1) tablet 100 mg  100 mg Oral Daily Mansy, Jan A, MD   100 mg at 08/25/22 1354   Or   thiamine (VITAMIN B1) injection 100 mg  100 mg Intravenous Daily Mansy, Jan A, MD       Current Outpatient Medications  Medication Sig Dispense Refill   Omeprazole Magnesium (PRILOSEC OTC PO) Take 20 mg by mouth daily.     furosemide (LASIX) 20 MG tablet Take 1 tablet (20 mg total) by mouth daily for 5 days. 5 tablet 0    OBJECTIVE: Vitals:   08/25/22 1351 08/25/22 1717  BP: (!)  140/82 133/88  Pulse:  72  Resp:  19  Temp: 98.2 F (36.8 C) 98.8 F (37.1 C)  SpO2:       Body mass index is 28.19 kg/m.    ECOG FS:0 - Asymptomatic  General: Well-developed, well-nourished, no acute distress. Eyes: Pink conjunctiva, anicteric sclera. HEENT: Normocephalic, moist mucous membranes. Lungs: No audible wheezing or coughing. Heart: Regular rate and rhythm. Abdomen: Soft, nontender, no obvious distention. Musculoskeletal: No edema, cyanosis, or clubbing. Neuro: Alert, answering all questions appropriately. Cranial nerves grossly intact. Skin: No rashes or petechiae noted. Psych: Normal affect. Lymphatics: No cervical, calvicular, axillary or inguinal LAD.   LAB RESULTS:  Lab Results  Component Value Date   NA 141 08/24/2022   K 3.4 (L) 08/24/2022   CL 110 08/24/2022   CO2 18 (L) 08/24/2022   GLUCOSE 101 (H) 08/24/2022   BUN 10 08/24/2022   CREATININE 0.78 08/24/2022   CALCIUM 8.1 (L) 08/24/2022   PROT 7.4 08/24/2022   ALBUMIN 3.3 (L) 08/24/2022   AST 226 (H) 08/24/2022   ALT 82 (H) 08/24/2022   ALKPHOS 168 (H) 08/24/2022   BILITOT 8.5 (H) 08/24/2022   GFRNONAA >60 08/24/2022   GFRAA >60 07/23/2013    Lab Results  Component Value Date   WBC 2.0 (L) 08/25/2022   NEUTROABS 1.3 (L) 08/25/2022   HGB 7.4 (L) 08/25/2022   HCT 25.1 (L) 08/25/2022   MCV 74.9 (L) 08/25/2022   PLT 19 (LL) 08/25/2022   Lab Results  Component Value Date   IRON 55 08/25/2022   TIBC 363 08/25/2022   IRONPCTSAT 15 (L) 08/25/2022   Lab Results  Component Value Date   FERRITIN 37 08/25/2022     STUDIES: US LIVER DOPPLER LIMITED (PV OR SINGLE ART/VEIN)  Result Date: 08/25/2022 CLINICAL DATA:  Right upper quadrant pain Cirrhosis EXAM: DUPLEX ULTRASOUND OF LIVER TECHNIQUE: Color and duplex Doppler ultrasound was performed to evaluate the hepatic in-flow and out-flow vessels. COMPARISON:  CT abdomen pelvis 08/24/2022 FINDINGS: Liver: Diffuse heterogeneity of hepatic  parenchyma. Nodular contours. No focal hepatic lesion. Main Portal Vein size: 1.0 cm Portal Vein Velocities Main Prox:  24 cm/sec Main Mid: 27 cm/sec Main Dist:  16 cm/sec Right: 30 cm/sec Left: 21 cm/sec Portal vein only imaging per request. IMPRESSION: 1. Patent portal vein with appropriate direction of flow. 2.  Cirrhotic liver morphology without focal hepatic lesion. Electronically Signed   By: Acquanetta Belling M.D.   On: 08/25/2022 13:19   CT ABDOMEN PELVIS W CONTRAST  Result Date: 08/24/2022 CLINICAL DATA:  Abdominal pain, acute, nonlocalized EXAM: CT ABDOMEN AND PELVIS WITH CONTRAST TECHNIQUE: Multidetector CT imaging of the abdomen and pelvis was performed using the standard protocol following bolus administration of intravenous contrast.  RADIATION DOSE REDUCTION: This exam was performed according to the departmental dose-optimization program which includes automated exposure control, adjustment of the mA and/or kV according to patient size and/or use of iterative reconstruction technique. CONTRAST:  OMNIPAQUE IOHEXOL 300 MG/ML  SOLN COMPARISON:  CT dated November 16, 2005. FINDINGS: Lower chest: No acute abnormality. Hepatobiliary: Hepatic steatosis. Nodular liver contour. Recanalized paraumbilical vein. Main portal vein is grossly patent with multiple adjacent collaterals in the porta hepatis as well as additional portosystemic shunts. Multiple areas of stellate appearing enhancement likely reflecting fibrosis. Pancreas: Multiple coarse calcifications of the pancreas consistent with sequela of chronic pancreatitis. No peripancreatic fat stranding. Spleen: Splenomegaly. Multiple distended adjacent splenic collateral vessels. Adrenals/Urinary Tract: Adrenal glands are unremarkable. Multiple nonobstructing bilateral nephrolithiasis. No obstructing nephrolithiasis. No hydronephrosis. Evaluation of the ureterovesicular junction is limited due to streak artifact from hip arthroplasty. Bladder is  unremarkable. Stomach/Bowel: No evidence of bowel obstruction. Moderate hiatal hernia. Prominence of the stomach folds. Appendix is normal. Vascular/Lymphatic: Abdominal aorta is normal in caliber. No visualized lymphadenopathy. Retroaortic LEFT renal vein. Reproductive: Prostate is obscured by streak artifact. Other: No free air or free fluid. Musculoskeletal: Status post bilateral hip arthroplasty. IMPRESSION: 1. Cirrhosis with sequela of portal hypertension including splenomegaly and multiple portosystemic shunts. 2. Prominence of the stomach folds. This could reflect gastritis in the appropriate clinical setting. 3. Moderate hiatal hernia. 4. Nonobstructing bilateral nephrolithiasis. 5. Sequela of chronic pancreatitis. Electronically Signed   By: Meda Klinefelter M.D.   On: 08/24/2022 20:08   DG Chest 2 View  Result Date: 08/24/2022 CLINICAL DATA:  upper GI bleed EXAM: CHEST - 2 VIEW COMPARISON:  None Available. FINDINGS: The cardiomediastinal silhouette is normal in contour. No pleural effusion. No pneumothorax. No acute pleuroparenchymal abnormality. Visualized abdomen is unremarkable. No acute osseous abnormality noted. IMPRESSION: No acute cardiopulmonary abnormality. Electronically Signed   By: Meda Klinefelter M.D.   On: 08/24/2022 19:24   US ABDOMEN LIMITED RUQ (LIVER/GB)  Result Date: 08/24/2022 CLINICAL DATA:  Pain EXAM: ULTRASOUND ABDOMEN LIMITED RIGHT UPPER QUADRANT COMPARISON:  None Available. FINDINGS: Gallbladder: No gallstones or wall thickening visualized. No sonographic Murphy sign noted by sonographer. Common bile duct: Not able to visualized to overlying bowel gas. Liver: Evaluation is limited to decreased penetration, Underlying mass can not be excluded given exam limitations. Nodular and coarse echotexture parenchymal echogenicity. Portal vein is not visualized due to overlying bowel bowel-gas. Other: None. IMPRESSION: 1. Markedly limited exam due to patient body habitus and  overlying bowel gas. 2. Nodular and coarse echotexture of the liver parenchyma, suggestive of cirrhosis. Underlying mass can not be excluded given exam limitations. 3. Bile ducts and portal vein are not visualized due to exam limitations. 4. Visualized gallbladder is unremarkable. Electronically Signed   By: Allegra Lai M.D.   On: 08/24/2022 19:01    ASSESSMENT: Pancytopenia.  PLAN:    Anemia: Likely multifactorial from bone marrow suppression of persistent alcohol abuse and GI bleed.  Patient's hemoglobin is decreased at 7.4, iron stores are essentially within normal limits except for mildly decreased saturation ratio.  Folate within normal limits.  Hemolysis labs were ordered for completeness and are pending at time of dictation.  Agree with one 1 unit packed red blood cells.  Appreciate GI input with plans for endoscopy tomorrow. Thrombocytopenia: Also multifactorial from alcohol abuse and splenomegaly.  Patient's platelets were noted to be 129 in May 2024.  He has been initiated on 50 mg prednisone with plans of transfusing 2 units platelets  prior to EGD tomorrow. Leukopenia: Also likely due to alcohol suppression of bone marrow.  No intervention needed.  Patient does not require bone marrow biopsy at this time, but would consider 1 in the future if patient amenable. GI bleed: Endoscopy tomorrow. Liver dysfunction: Likely secondary to alcoholic cirrhosis.   Appreciate consult, will follow.    Jeralyn Ruths, MD   08/25/2022 5:34 PM

## 2022-08-25 NOTE — Assessment & Plan Note (Signed)
-   This could be related to alcoholic chronic pancreatitis. - We will continue his Creon.

## 2022-08-25 NOTE — Assessment & Plan Note (Addendum)
-   The patient will be admitted to a unit bed. - We will continue him on IV Protonix and octreotide drips. - GI consult will be obtained. - I notified Dr. Tobi Bastos about the patient. - We will keep him n.p.o. except for medications. - He will be hydrated with IV normal saline. - We will repeat abdominal ultrasound especially to rule out portal vein thrombosis. - Acute hepatitis panel was ordered and is currently pending.

## 2022-08-25 NOTE — Progress Notes (Addendum)
[facility]  - [place] at [facility]   PATIENT NAME: Stephen Mayer    MR#:  [id number]  DATE OF BIRTH:  26-May-1975  SUBJECTIVE:   Patient seen earlier in the ER. Father at bedside. Came in with intermittent vomiting blood at home. Last vomiting was yesterday. Denies any abdominal pain. Had some nausea. Wondering if he is going to get anything to eat.   VITALS:  Blood pressure (!) 140/82, pulse 71, temperature 98.2 F (36.8 C), temperature source Oral, resp. rate 16, height 5\' 7"  (1.702 m), weight 81.6 kg, SpO2 92%.  PHYSICAL EXAMINATION:   GENERAL:  47 y.o.-year-old patient with no acute distress.  LUNGS: Normal breath sounds bilaterally, no wheezing CARDIOVASCULAR: S1, S2 normal. No murmur   ABDOMEN: Soft, nontender, nondistended. Bowel sounds present.  EXTREMITIES: No  edema b/l.    NEUROLOGIC: nonfocal  patient is alert and awake SKIN: No obvious rash, lesion, or ulcer.   LABORATORY PANEL:  CBC Recent Labs  Lab 08/24/22 1348  WBC 3.4*  HGB 8.1*  HCT 26.8*  PLT 32*    Chemistries  Recent Labs  Lab 08/24/22 1348  NA 141  K 3.4*  CL 110  CO2 18*  GLUCOSE 101*  BUN 10  CREATININE 0.78  CALCIUM 8.1*  AST 226*  ALT 82*  ALKPHOS 168*  BILITOT 8.5*   Cardiac Enzymes No results for input(s): "TROPONINI" in the last 168 hours. RADIOLOGY:  US LIVER DOPPLER LIMITED (PV OR SINGLE ART/VEIN)  Result Date: 08/25/2022 CLINICAL DATA:  Right upper quadrant pain Cirrhosis EXAM: DUPLEX ULTRASOUND OF LIVER TECHNIQUE: Color and duplex Doppler ultrasound was performed to evaluate the hepatic in-flow and out-flow vessels. COMPARISON:  CT abdomen pelvis 08/24/2022 FINDINGS: Liver: Diffuse heterogeneity of hepatic parenchyma. Nodular contours. No focal hepatic lesion. Main Portal Vein size: 1.0 cm Portal Vein Velocities Main Prox:  24 cm/sec Main Mid: 27 cm/sec Main Dist:  16 cm/sec Right: 30 cm/sec Left: 21 cm/sec Portal vein only imaging per request. IMPRESSION:  1. Patent portal vein with appropriate direction of flow. 2.  Cirrhotic liver morphology without focal hepatic lesion. Electronically Signed   By: Acquanetta Belling M.D.   On: 08/25/2022 13:19   CT ABDOMEN PELVIS W CONTRAST  Result Date: 08/24/2022 CLINICAL DATA:  Abdominal pain, acute, nonlocalized EXAM: CT ABDOMEN AND PELVIS WITH CONTRAST TECHNIQUE: Multidetector CT imaging of the abdomen and pelvis was performed using the standard protocol following bolus administration of intravenous contrast. RADIATION DOSE REDUCTION: This exam was performed according to the departmental dose-optimization program which includes automated exposure control, adjustment of the mA and/or kV according to patient size and/or use of iterative reconstruction technique. CONTRAST:  OMNIPAQUE IOHEXOL 300 MG/ML  SOLN COMPARISON:  CT dated November 16, 2005. FINDINGS: Lower chest: No acute abnormality. Hepatobiliary: Hepatic steatosis. Nodular liver contour. Recanalized paraumbilical vein. Main portal vein is grossly patent with multiple adjacent collaterals in the porta hepatis as well as additional portosystemic shunts. Multiple areas of stellate appearing enhancement likely reflecting fibrosis. Pancreas: Multiple coarse calcifications of the pancreas consistent with sequela of chronic pancreatitis. No peripancreatic fat stranding. Spleen: Splenomegaly. Multiple distended adjacent splenic collateral vessels. Adrenals/Urinary Tract: Adrenal glands are unremarkable. Multiple nonobstructing bilateral nephrolithiasis. No obstructing nephrolithiasis. No hydronephrosis. Evaluation of the ureterovesicular junction is limited due to streak artifact from hip arthroplasty. Bladder is unremarkable. Stomach/Bowel: No evidence of bowel obstruction. Moderate hiatal hernia. Prominence of the stomach folds. Appendix is normal. Vascular/Lymphatic: Abdominal aorta is normal in caliber. No visualized  lymphadenopathy. Retroaortic LEFT renal vein.  Reproductive: Prostate is obscured by streak artifact. Other: No free air or free fluid. Musculoskeletal: Status post bilateral hip arthroplasty. IMPRESSION: 1. Cirrhosis with sequela of portal hypertension including splenomegaly and multiple portosystemic shunts. 2. Prominence of the stomach folds. This could reflect gastritis in the appropriate clinical setting. 3. Moderate hiatal hernia. 4. Nonobstructing bilateral nephrolithiasis. 5. Sequela of chronic pancreatitis. Electronically Signed   By: Meda Klinefelter M.D.   On: 08/24/2022 20:08   DG Chest 2 View  Result Date: 08/24/2022 CLINICAL DATA:  upper GI bleed EXAM: CHEST - 2 VIEW COMPARISON:  None Available. FINDINGS: The cardiomediastinal silhouette is normal in contour. No pleural effusion. No pneumothorax. No acute pleuroparenchymal abnormality. Visualized abdomen is unremarkable. No acute osseous abnormality noted. IMPRESSION: No acute cardiopulmonary abnormality. Electronically Signed   By: Meda Klinefelter M.D.   On: 08/24/2022 19:24   US ABDOMEN LIMITED RUQ (LIVER/GB)  Result Date: 08/24/2022 CLINICAL DATA:  Pain EXAM: ULTRASOUND ABDOMEN LIMITED RIGHT UPPER QUADRANT COMPARISON:  None Available. FINDINGS: Gallbladder: No gallstones or wall thickening visualized. No sonographic Murphy sign noted by sonographer. Common bile duct: Not able to visualized to overlying bowel gas. Liver: Evaluation is limited to decreased penetration, Underlying mass can not be excluded given exam limitations. Nodular and coarse echotexture parenchymal echogenicity. Portal vein is not visualized due to overlying bowel bowel-gas. Other: None. IMPRESSION: 1. Markedly limited exam due to patient body habitus and overlying bowel gas. 2. Nodular and coarse echotexture of the liver parenchyma, suggestive of cirrhosis. Underlying mass can not be excluded given exam limitations. 3. Bile ducts and portal vein are not visualized due to exam limitations. 4. Visualized  gallbladder is unremarkable. Electronically Signed   By: Allegra Lai M.D.   On: 08/24/2022 19:01    Assessment and Plan  Stephen Mayer is a 47 y.o. male with medical history significant for alcohol use, GERD, osteonecrosis, pancreatic insufficiency as well as alcoholic liver cirrhosis, who presented to the emergency room with acute onset of right upper quadrant abdominal pain with associated nausea and hematemesis with bright red blood which have been intermittent over the last 5 days with several episodes per day.    GI bleeding-- could be in the setting of esophageal v varices with history of alcoholic cirrhosis of liver disease and portal hypertension versus rule out peptic ulcer disease - continue him on IV Protonix and octreotide drips. - GI consult with  Dr. Tobi Bastos -- recommends a EGD bunch better, stable  - CLD -  IV normal saline. - Liver Doppler ultrasound Patent portal vein with appropriate direction of flow. 2.  Cirrhotic liver morphology without focal hepatic lesion.  Elevated LFTs - This could be related to alcoholic hepatitis and liver cirrhosis.  They are worse than previous levels last year in January.   Hypokalemia - Potassium will be replaced.   Metabolic acidosis - This could be related to alcohol abuse. - cont IVF   Alcohol abuse - He will be placed on CIWA protocol for possible early withdrawal.   Pancreatic insufficiency - due  to alcoholic chronic pancreatitis. - continue his Creon.  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 Orlie Dakin hematology to see patient. Ordered some labs. 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  -- Dr. Orlie Dakin will review CBC with differential. -- Will repeat platelet count and if low consider palliative transfusion since G.I. planning  to do EGD  Procedures: Family communication :father in ER Consults : G.I.,  hematology CODE STATUS: full DVT Prophylaxis : SCD Level of care: Progressive Status is: Inpatient Remains inpatient appropriate because: GI bleed    TOTAL TIME TAKING CARE OF THIS PATIENT: 40 minutes.  >50% time spent on counselling and coordination of care  Note: This dictation was prepared with Dragon dictation along with smaller phrase technology. Any transcriptional errors that result from this process are unintentional.  Enedina Finner M.D    [facility]   CC: Primary care physician; Pcp, No

## 2022-08-26 DIAGNOSIS — K922 Gastrointestinal hemorrhage, unspecified: Secondary | ICD-10-CM | POA: Diagnosis not present

## 2022-08-26 DIAGNOSIS — K7030 Alcoholic cirrhosis of liver without ascites: Secondary | ICD-10-CM | POA: Diagnosis not present

## 2022-08-26 DIAGNOSIS — E876 Hypokalemia: Secondary | ICD-10-CM | POA: Diagnosis not present

## 2022-08-26 DIAGNOSIS — E872 Acidosis, unspecified: Secondary | ICD-10-CM | POA: Diagnosis not present

## 2022-08-26 DIAGNOSIS — K920 Hematemesis: Secondary | ICD-10-CM | POA: Diagnosis not present

## 2022-08-26 LAB — CBC
HCT: 27.9 % — ABNORMAL LOW (ref 39.0–52.0)
Hemoglobin: 8.3 g/dL — ABNORMAL LOW (ref 13.0–17.0)
MCH: 22.9 pg — ABNORMAL LOW (ref 26.0–34.0)
MCHC: 29.7 g/dL — ABNORMAL LOW (ref 30.0–36.0)
MCV: 77.1 fL — ABNORMAL LOW (ref 80.0–100.0)
Platelets: 41 10*3/uL — ABNORMAL LOW (ref 150–400)
RBC: 3.62 MIL/uL — ABNORMAL LOW (ref 4.22–5.81)
RDW: 23.8 % — ABNORMAL HIGH (ref 11.5–15.5)
WBC: 2.9 10*3/uL — ABNORMAL LOW (ref 4.0–10.5)
nRBC: 0.7 % — ABNORMAL HIGH (ref 0.0–0.2)

## 2022-08-26 LAB — VITAMIN B12: Vitamin B-12: 1159 pg/mL — ABNORMAL HIGH (ref 180–914)

## 2022-08-26 LAB — PREPARE PLATELET PHERESIS: Unit division: 0

## 2022-08-26 LAB — TYPE AND SCREEN
ABO/RH(D): A POS
Antibody Screen: NEGATIVE
Unit division: 0
Unit division: 0

## 2022-08-26 LAB — HEPATITIS PANEL, ACUTE
HCV Ab: REACTIVE — AB
Hep A IgM: NONREACTIVE
Hep B C IgM: NONREACTIVE
Hepatitis B Surface Ag: NONREACTIVE

## 2022-08-26 LAB — BPAM RBC
Blood Product Expiration Date: 202408072359
Blood Product Expiration Date: 202408102359
ISSUE DATE / TIME: 202407111823
Unit Type and Rh: 6200
Unit Type and Rh: 6200

## 2022-08-26 LAB — BPAM PLATELET PHERESIS
ISSUE DATE / TIME: 202407111708
ISSUE DATE / TIME: 202407120052
Unit Type and Rh: 5100

## 2022-08-26 LAB — HAPTOGLOBIN: Haptoglobin: <10 mg/dL — ABNORMAL LOW (ref 23–355)

## 2022-08-26 NOTE — ED Notes (Signed)
Patient reports his last drink of ETOH was on Tuesday.  No current signs of withdrawals

## 2022-08-26 NOTE — ED Notes (Signed)
Consent for blood and blood products obtained at this time.  Patient reports he has not signed any forms of consents prior.  States "they called me on the phone and got consent earlier."

## 2022-08-26 NOTE — Progress Notes (Signed)
Wyline Mood , MD [address], [address], [place], [place], [address] [address], [place], [place], [address] Phone: [phone]  Fax: [phone]   Stephen Mayer is being followed for gi bleed  Day 1 of follow up   Subjective: No vomiting , no bowel movements   Objective: Vital signs in last 24 hours: Vitals:   08/26/22 0130 08/26/22 0222 08/26/22 0645 08/26/22 0649  BP: 129/84 131/78 139/71 139/71  Pulse: (!) 51 (!) 43 (!) 45 75  Resp: 16 16 16    Temp:  98.2 F (36.8 C) 98 F (36.7 C)   TempSrc:  Oral Oral   SpO2: 93% 96% 93%   Weight:      Height:       Weight change: 0 kg  Intake/Output Summary (Last 24 hours) at 08/26/2022 0815 Last data filed at 08/26/2022 0221 Gross per 24 hour  Intake 885.5 ml  Output --  Net 885.5 ml     Exam:  Abdomen: soft, nontender, normal bowel sounds   Lab Results: @LABTEST2 @ Micro Results: No results found for this or any previous visit (from the past 240 hour(s)). Studies/Results: US LIVER DOPPLER LIMITED (PV OR SINGLE ART/VEIN)  Result Date: 08/25/2022 CLINICAL DATA:  Right upper quadrant pain Cirrhosis EXAM: DUPLEX ULTRASOUND OF LIVER TECHNIQUE: Color and duplex Doppler ultrasound was performed to evaluate the hepatic in-flow and out-flow vessels. COMPARISON:  CT abdomen pelvis 08/24/2022 FINDINGS: Liver: Diffuse heterogeneity of hepatic parenchyma. Nodular contours. No focal hepatic lesion. Main Portal Vein size: 1.0 cm Portal Vein Velocities Main Prox:  24 cm/sec Main Mid: 27 cm/sec Main Dist:  16 cm/sec Right: 30 cm/sec Left: 21 cm/sec Portal vein only imaging per request. IMPRESSION: 1. Patent portal vein with appropriate direction of flow. 2.  Cirrhotic liver morphology without focal hepatic lesion. Electronically Signed   By: Acquanetta Belling M.D.   On: 08/25/2022 13:19   CT ABDOMEN PELVIS W CONTRAST  Result Date: 08/24/2022 CLINICAL DATA:  Abdominal pain, acute, nonlocalized EXAM: CT ABDOMEN AND PELVIS  WITH CONTRAST TECHNIQUE: Multidetector CT imaging of the abdomen and pelvis was performed using the standard protocol following bolus administration of intravenous contrast. RADIATION DOSE REDUCTION: This exam was performed according to the departmental dose-optimization program which includes automated exposure control, adjustment of the mA and/or kV according to patient size and/or use of iterative reconstruction technique. CONTRAST:  OMNIPAQUE IOHEXOL 300 MG/ML  SOLN COMPARISON:  CT dated November 16, 2005. FINDINGS: Lower chest: No acute abnormality. Hepatobiliary: Hepatic steatosis. Nodular liver contour. Recanalized paraumbilical vein. Main portal vein is grossly patent with multiple adjacent collaterals in the porta hepatis as well as additional portosystemic shunts. Multiple areas of stellate appearing enhancement likely reflecting fibrosis. Pancreas: Multiple coarse calcifications of the pancreas consistent with sequela of chronic pancreatitis. No peripancreatic fat stranding. Spleen: Splenomegaly. Multiple distended adjacent splenic collateral vessels. Adrenals/Urinary Tract: Adrenal glands are unremarkable. Multiple nonobstructing bilateral nephrolithiasis. No obstructing nephrolithiasis. No hydronephrosis. Evaluation of the ureterovesicular junction is limited due to streak artifact from hip arthroplasty. Bladder is unremarkable. Stomach/Bowel: No evidence of bowel obstruction. Moderate hiatal hernia. Prominence of the stomach folds. Appendix is normal. Vascular/Lymphatic: Abdominal aorta is normal in caliber. No visualized lymphadenopathy. Retroaortic LEFT renal vein. Reproductive: Prostate is obscured by streak artifact. Other: No free air or free fluid. Musculoskeletal: Status post bilateral hip arthroplasty. IMPRESSION: 1. Cirrhosis with sequela of portal hypertension including splenomegaly and multiple portosystemic shunts. 2. Prominence of the stomach folds. This could reflect  gastritis in the  appropriate clinical setting. 3. Moderate hiatal hernia. 4. Nonobstructing bilateral nephrolithiasis. 5. Sequela of chronic pancreatitis. Electronically Signed   By: Meda Klinefelter M.D.   On: 08/24/2022 20:08   DG Chest 2 View  Result Date: 08/24/2022 CLINICAL DATA:  upper GI bleed EXAM: CHEST - 2 VIEW COMPARISON:  None Available. FINDINGS: The cardiomediastinal silhouette is normal in contour. No pleural effusion. No pneumothorax. No acute pleuroparenchymal abnormality. Visualized abdomen is unremarkable. No acute osseous abnormality noted. IMPRESSION: No acute cardiopulmonary abnormality. Electronically Signed   By: Meda Klinefelter M.D.   On: 08/24/2022 19:24   US ABDOMEN LIMITED RUQ (LIVER/GB)  Result Date: 08/24/2022 CLINICAL DATA:  Pain EXAM: ULTRASOUND ABDOMEN LIMITED RIGHT UPPER QUADRANT COMPARISON:  None Available. FINDINGS: Gallbladder: No gallstones or wall thickening visualized. No sonographic Murphy sign noted by sonographer. Common bile duct: Not able to visualized to overlying bowel gas. Liver: Evaluation is limited to decreased penetration, Underlying mass can not be excluded given exam limitations. Nodular and coarse echotexture parenchymal echogenicity. Portal vein is not visualized due to overlying bowel bowel-gas. Other: None. IMPRESSION: 1. Markedly limited exam due to patient body habitus and overlying bowel gas. 2. Nodular and coarse echotexture of the liver parenchyma, suggestive of cirrhosis. Underlying mass can not be excluded given exam limitations. 3. Bile ducts and portal vein are not visualized due to exam limitations. 4. Visualized gallbladder is unremarkable. Electronically Signed   By: Allegra Lai M.D.   On: 08/24/2022 19:01   Medications: I have reviewed the patient's current medications. Scheduled Meds:  sodium chloride   Intravenous Once   acetaminophen  650 mg Oral Once   folic acid  1 mg Oral Daily   multivitamin with minerals  1 tablet Oral Daily    pantoprazole (PROTONIX) IV  40 mg Intravenous Q12H   predniSONE  50 mg Oral Q breakfast   thiamine  100 mg Oral Daily   Or   thiamine  100 mg Intravenous Daily   Continuous Infusions:  octreotide (SANDOSTATIN) 500 mcg in sodium chloride 0.9 % 250 mL (2 mcg/mL) infusion 50 mcg/hr (08/26/22 0228)   PRN Meds:.LORazepam **OR** LORazepam, LORazepam, morphine injection, oxyCODONE-acetaminophen   Assessment: Principal Problem:   GI bleeding Active Problems:   Pancreatic insufficiency   Chronic pain   Hypokalemia   Metabolic acidosis   Elevated LFTs   Alcohol abuse   Alcoholic cirrhosis of liver without ascites (HCC)   Hematemesis with nausea   Pancytopenia (HCC)   GI bleed  Stephen Mayer 47 y.o. male with a history of excess alcohol consumption presented with right upper quadrant pain nausea vomiting hematemesis last episode was day before yesterday morning over 48 hours back.  It appears his episodes of hematemesis were triggered by episodes of gagging.  His stools have been brown or green in color denies any melena or hematochezia.  I do not believe he is actively bleeding.  He has pancytopenia ,  he has cirrhosis seen [facility], Acute drop in platelet count to < 20 , Baseline platelet count in the 130s.  He  Liver function tests are abnormal likely due to secondary effects of alcohol.No PVT on doppler liver, normal iron studies. Noovert blood loss since admission    Plan 1.  Monitor CBC transfuse as needed 2.  IV PPI, octreotide, antibiotics 3.  Platelet count is very low : Oncology following  4.  He will need to stop drinking all alcohol 5.  EGD when platelet count is  over 50.  6.  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

## 2022-08-26 NOTE — ED Notes (Signed)
Patient resting quietly with eyes closed.  Respirations even and unlabored.  °

## 2022-08-26 NOTE — Progress Notes (Signed)
[facility]  - [place] at [facility]   PATIENT NAME: Stephen Mayer    MR#:  [id number]  DATE OF BIRTH:  1975/05/17  SUBJECTIVE:   No hematemesis reported. Patient tolerating clear liquid diet. No family at bedside.  VITALS:  Blood pressure 128/80, pulse (!) 53, temperature 98.2 F (36.8 C), resp. rate 17, height 5\' 7"  (1.702 m), weight 81.6 kg, SpO2 98%.  PHYSICAL EXAMINATION:   GENERAL:  47 y.o.-year-old patient with no acute distress.  LUNGS: Normal breath sounds bilaterally, no wheezing CARDIOVASCULAR: S1, S2 normal. No murmur   ABDOMEN: Soft, nontender, nondistended. Bowel sounds present.  EXTREMITIES: No  edema b/l.    NEUROLOGIC: nonfocal  patient is alert and awake SKIN: No obvious rash, lesion, or ulcer.   LABORATORY PANEL:  CBC Recent Labs  Lab 08/26/22 0643  WBC 2.9*  HGB 8.3*  HCT 27.9*  PLT 41*    Chemistries  Recent Labs  Lab 08/24/22 1348  NA 141  K 3.4*  CL 110  CO2 18*  GLUCOSE 101*  BUN 10  CREATININE 0.78  CALCIUM 8.1*  AST 226*  ALT 82*  ALKPHOS 168*  BILITOT 8.5*   Cardiac Enzymes No results for input(s): "TROPONINI" in the last 168 hours. RADIOLOGY:  US LIVER DOPPLER LIMITED (PV OR SINGLE ART/VEIN)  Result Date: 08/25/2022 CLINICAL DATA:  Right upper quadrant pain Cirrhosis EXAM: DUPLEX ULTRASOUND OF LIVER TECHNIQUE: Color and duplex Doppler ultrasound was performed to evaluate the hepatic in-flow and out-flow vessels. COMPARISON:  CT abdomen pelvis 08/24/2022 FINDINGS: Liver: Diffuse heterogeneity of hepatic parenchyma. Nodular contours. No focal hepatic lesion. Main Portal Vein size: 1.0 cm Portal Vein Velocities Main Prox:  24 cm/sec Main Mid: 27 cm/sec Main Dist:  16 cm/sec Right: 30 cm/sec Left: 21 cm/sec Portal vein only imaging per request. IMPRESSION: 1. Patent portal vein with appropriate direction of flow. 2.  Cirrhotic liver morphology without focal hepatic lesion. Electronically Signed   By: Acquanetta Belling M.D.    On: 08/25/2022 13:19   CT ABDOMEN PELVIS W CONTRAST  Result Date: 08/24/2022 CLINICAL DATA:  Abdominal pain, acute, nonlocalized EXAM: CT ABDOMEN AND PELVIS WITH CONTRAST TECHNIQUE: Multidetector CT imaging of the abdomen and pelvis was performed using the standard protocol following bolus administration of intravenous contrast. RADIATION DOSE REDUCTION: This exam was performed according to the departmental dose-optimization program which includes automated exposure control, adjustment of the mA and/or kV according to patient size and/or use of iterative reconstruction technique. CONTRAST:  OMNIPAQUE IOHEXOL 300 MG/ML  SOLN COMPARISON:  CT dated November 16, 2005. FINDINGS: Lower chest: No acute abnormality. Hepatobiliary: Hepatic steatosis. Nodular liver contour. Recanalized paraumbilical vein. Main portal vein is grossly patent with multiple adjacent collaterals in the porta hepatis as well as additional portosystemic shunts. Multiple areas of stellate appearing enhancement likely reflecting fibrosis. Pancreas: Multiple coarse calcifications of the pancreas consistent with sequela of chronic pancreatitis. No peripancreatic fat stranding. Spleen: Splenomegaly. Multiple distended adjacent splenic collateral vessels. Adrenals/Urinary Tract: Adrenal glands are unremarkable. Multiple nonobstructing bilateral nephrolithiasis. No obstructing nephrolithiasis. No hydronephrosis. Evaluation of the ureterovesicular junction is limited due to streak artifact from hip arthroplasty. Bladder is unremarkable. Stomach/Bowel: No evidence of bowel obstruction. Moderate hiatal hernia. Prominence of the stomach folds. Appendix is normal. Vascular/Lymphatic: Abdominal aorta is normal in caliber. No visualized lymphadenopathy. Retroaortic LEFT renal vein. Reproductive: Prostate is obscured by streak artifact. Other: No free air or free fluid. Musculoskeletal: Status post bilateral hip arthroplasty. IMPRESSION: 1. Cirrhosis  with  sequela of portal hypertension including splenomegaly and multiple portosystemic shunts. 2. Prominence of the stomach folds. This could reflect gastritis in the appropriate clinical setting. 3. Moderate hiatal hernia. 4. Nonobstructing bilateral nephrolithiasis. 5. Sequela of chronic pancreatitis. Electronically Signed   By: Meda Klinefelter M.D.   On: 08/24/2022 20:08   DG Chest 2 View  Result Date: 08/24/2022 CLINICAL DATA:  upper GI bleed EXAM: CHEST - 2 VIEW COMPARISON:  None Available. FINDINGS: The cardiomediastinal silhouette is normal in contour. No pleural effusion. No pneumothorax. No acute pleuroparenchymal abnormality. Visualized abdomen is unremarkable. No acute osseous abnormality noted. IMPRESSION: No acute cardiopulmonary abnormality. Electronically Signed   By: Meda Klinefelter M.D.   On: 08/24/2022 19:24   US ABDOMEN LIMITED RUQ (LIVER/GB)  Result Date: 08/24/2022 CLINICAL DATA:  Pain EXAM: ULTRASOUND ABDOMEN LIMITED RIGHT UPPER QUADRANT COMPARISON:  None Available. FINDINGS: Gallbladder: No gallstones or wall thickening visualized. No sonographic Murphy sign noted by sonographer. Common bile duct: Not able to visualized to overlying bowel gas. Liver: Evaluation is limited to decreased penetration, Underlying mass can not be excluded given exam limitations. Nodular and coarse echotexture parenchymal echogenicity. Portal vein is not visualized due to overlying bowel bowel-gas. Other: None. IMPRESSION: 1. Markedly limited exam due to patient body habitus and overlying bowel gas. 2. Nodular and coarse echotexture of the liver parenchyma, suggestive of cirrhosis. Underlying mass can not be excluded given exam limitations. 3. Bile ducts and portal vein are not visualized due to exam limitations. 4. Visualized gallbladder is unremarkable. Electronically Signed   By: Allegra Lai M.D.   On: 08/24/2022 19:01    Assessment and Plan  Stephen Mayer is a 47 y.o. male with medical  history significant for alcohol use, GERD, osteonecrosis, pancreatic insufficiency as well as alcoholic liver cirrhosis, who presented to the emergency room with acute onset of right upper quadrant abdominal pain with associated nausea and hematemesis with bright red blood which have been intermittent over the last 5 days with several episodes per day.    GI bleeding-- could be in the setting of esophageal v varices with history of alcoholic cirrhosis of liver disease and portal hypertension versus rule out peptic ulcer disease - continue him on IV Protonix and octreotide drips. - GI consult with  Dr. Tobi Bastos -- recommends a EGD bunch better, stable  - CLD -  IV normal saline. - Liver Doppler ultrasound Patent portal vein with appropriate direction of flow. 2.  Cirrhotic liver morphology without focal hepatic lesion. --7/12-- no G.I. bleed reported. Will continue to monitor for now. Clear liquid diet  Elevated LFTs - This could be related to alcoholic hepatitis and liver cirrhosis.  They are worse than previous levels last year in January.   Hypokalemia - Potassium will be replaced.   Metabolic acidosis - This could be related to alcohol abuse. - cont IVF   Alcohol abuse - on CIWA protocol for possible early withdrawal.   Pancreatic insufficiency - due  to alcoholic chronic pancreatitis. - continue his Creon.  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 Orlie Dakin hematology to see patient. Ordered some labs. 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  -- Dr. Orlie Dakin will review CBC with differential. -- Will repeat platelet count and if low consider palliative transfusion since G.I. planning to do EGD -- counts stable after one unit of PR BC and two units of platelet. Will continue to  monitor.  Procedures: Family communication :father in ER Consults : G.I., hematology CODE  STATUS: full DVT Prophylaxis : SCD Level of care: Telemetry Medical Status is: Inpatient Remains inpatient appropriate because: GI bleed    TOTAL TIME TAKING CARE OF THIS PATIENT:35 minutes.  >50% time spent on counselling and coordination of care  Note: This dictation was prepared with Dragon dictation along with smaller phrase technology. Any transcriptional errors that result from this process are unintentional.  Enedina Finner M.D    [facility]   CC: Primary care physician; Pcp, No

## 2022-08-27 ENCOUNTER — Encounter
Admission: EM | Discharge status: Against Medical Advice | Payer: Self-pay | Source: Home / Self Care | Attending: Internal Medicine

## 2022-08-27 ENCOUNTER — Inpatient Hospital Stay: Payer: BLUE CROSS/BLUE SHIELD | Admitting: Certified Registered"

## 2022-08-27 ENCOUNTER — Encounter: Payer: Self-pay | Admitting: Internal Medicine

## 2022-08-27 DIAGNOSIS — K7030 Alcoholic cirrhosis of liver without ascites: Secondary | ICD-10-CM | POA: Diagnosis not present

## 2022-08-27 DIAGNOSIS — F1010 Alcohol abuse, uncomplicated: Secondary | ICD-10-CM | POA: Diagnosis not present

## 2022-08-27 DIAGNOSIS — D61818 Other pancytopenia: Secondary | ICD-10-CM | POA: Diagnosis not present

## 2022-08-27 DIAGNOSIS — E872 Acidosis, unspecified: Secondary | ICD-10-CM | POA: Diagnosis not present

## 2022-08-27 DIAGNOSIS — K922 Gastrointestinal hemorrhage, unspecified: Secondary | ICD-10-CM | POA: Diagnosis not present

## 2022-08-27 DIAGNOSIS — E876 Hypokalemia: Secondary | ICD-10-CM | POA: Diagnosis not present

## 2022-08-27 HISTORY — PX: ESOPHAGOGASTRODUODENOSCOPY (EGD) WITH PROPOFOL: SHX5813

## 2022-08-27 LAB — CBC
HCT: 26.6 % — ABNORMAL LOW (ref 39.0–52.0)
Hemoglobin: 8.2 g/dL — ABNORMAL LOW (ref 13.0–17.0)
MCH: 23.4 pg — ABNORMAL LOW (ref 26.0–34.0)
MCHC: 30.8 g/dL (ref 30.0–36.0)
MCV: 75.8 fL — ABNORMAL LOW (ref 80.0–100.0)
Platelets: 55 10*3/uL — ABNORMAL LOW (ref 150–400)
RBC: 3.51 MIL/uL — ABNORMAL LOW (ref 4.22–5.81)
RDW: 24.4 % — ABNORMAL HIGH (ref 11.5–15.5)
WBC: 4.2 10*3/uL (ref 4.0–10.5)
nRBC: 0 % (ref 0.0–0.2)

## 2022-08-27 LAB — PREPARE PLATELET PHERESIS: Unit division: 0

## 2022-08-27 LAB — BPAM PLATELET PHERESIS
Blood Product Expiration Date: 202407132359
Blood Product Expiration Date: 202407132359
Unit Type and Rh: 5100

## 2022-08-27 SURGERY — ESOPHAGOGASTRODUODENOSCOPY (EGD) WITH PROPOFOL
Anesthesia: General

## 2022-08-27 MED ORDER — PROPOFOL 500 MG/50ML IV EMUL
INTRAVENOUS | Status: DC | PRN
  Administered 2022-08-27: 50 mg via INTRAVENOUS
  Administered 2022-08-27: 80 ug via INTRAVENOUS
  Administered 2022-08-27: 20 mg via INTRAVENOUS
  Administered 2022-08-27: 100 mg via INTRAVENOUS
  Administered 2022-08-27: 20 mg via INTRAVENOUS

## 2022-08-27 MED ORDER — PROPOFOL 10 MG/ML IV BOLUS
INTRAVENOUS | Status: AC
  Filled 2022-08-27: qty 20

## 2022-08-27 MED ORDER — SODIUM CHLORIDE 0.9 % IV SOLN: INTRAVENOUS | Status: DC | PRN

## 2022-08-27 MED ORDER — SODIUM CHLORIDE 0.9 % IV SOLN: INTRAVENOUS | Status: DC

## 2022-08-27 MED ORDER — LIDOCAINE HCL (CARDIAC) PF 100 MG/5ML IV SOSY
PREFILLED_SYRINGE | INTRAVENOUS | Status: DC | PRN
  Administered 2022-08-27: 100 mg via INTRAVENOUS

## 2022-08-27 MED ORDER — PANTOPRAZOLE SODIUM 40 MG PO TBEC
40.0000 mg | DELAYED_RELEASE_TABLET | Freq: Two times a day (BID) | ORAL | Status: DC
  Administered 2022-08-27: 40 mg via ORAL
  Filled 2022-08-27: qty 1

## 2022-08-27 NOTE — Plan of Care (Addendum)
Pt was resting in room. Advanced the diet as tolerated. @1840  pt came out his room agitated and said I wants to leave now. Can you take this IV out of my hand. This nurse and charge nurse attempted talk to him and explain the risk of leaving hospitals with out medical provider advice. Pt refused to [facility]  and still wants  to leave. AMA paperwork signed.  Problem: Education: Goal: Knowledge of General Education information will improve Description: Including pain rating scale, medication(s)/side effects and non-pharmacologic comfort measures Outcome: Progressing   Problem: Health Behavior/Discharge Planning: Goal: Ability to manage health-related needs will improve Outcome: Progressing   Problem: Clinical Measurements: Goal: Ability to maintain clinical measurements within normal limits will improve Outcome: Progressing Goal: Will remain free from infection Outcome: Progressing Goal: Diagnostic test results will improve Outcome: Progressing Goal: Respiratory complications will improve Outcome: Progressing Goal: Cardiovascular complication will be avoided Outcome: Progressing   Problem: Activity: Goal: Risk for activity intolerance will decrease Outcome: Progressing   Problem: Nutrition: Goal: Adequate nutrition will be maintained Outcome: Progressing   Problem: Coping: Goal: Level of anxiety will decrease Outcome: Progressing   Problem: Elimination: Goal: Will not experience complications related to bowel motility Outcome: Progressing Goal: Will not experience complications related to urinary retention Outcome: Progressing   Problem: Pain Managment: Goal: General experience of comfort will improve Outcome: Progressing   Problem: Safety: Goal: Ability to remain free from injury will improve Outcome: Progressing   Problem: Skin Integrity: Goal: Risk for impaired skin integrity will decrease Outcome: Progressing

## 2022-08-27 NOTE — Progress Notes (Signed)
[facility]  - [place] at [facility]   PATIENT NAME: Stephen Mayer    MR#:  [id number]  DATE OF BIRTH:  25-Dec-1975  SUBJECTIVE:   No hematemesis reported. Patient tolerating clear liquid diet. No family at bedside. Patient ambulating around the nurses station.  VITALS:  Blood pressure (!) 127/98, pulse 72, temperature (!) 96.9 F (36.1 C), temperature source Temporal, resp. rate 20, height 5\' 7"  (1.702 m), weight 81.6 kg, SpO2 95%.  PHYSICAL EXAMINATION:   GENERAL:  47 y.o.-year-old patient with no acute distress.  LUNGS: Normal breath sounds bilaterally, no wheezing CARDIOVASCULAR: S1, S2 normal. No murmur   ABDOMEN: Soft, nontender, nondistended. Bowel sounds present.  EXTREMITIES: No  edema b/l.    NEUROLOGIC: nonfocal  patient is alert and awake SKIN: No obvious rash, lesion, or ulcer.   LABORATORY PANEL:  CBC Recent Labs  Lab 08/27/22 0809  WBC 4.2  HGB 8.2*  HCT 26.6*  PLT 55*    Chemistries  Recent Labs  Lab 08/24/22 1348  NA 141  K 3.4*  CL 110  CO2 18*  GLUCOSE 101*  BUN 10  CREATININE 0.78  CALCIUM 8.1*  AST 226*  ALT 82*  ALKPHOS 168*  BILITOT 8.5*   Cardiac Enzymes No results for input(s): "TROPONINI" in the last 168 hours. RADIOLOGY:  No results found.  Assessment and Plan  Stephen Mayer is a 47 y.o. male with medical history significant for alcohol use, GERD, osteonecrosis, pancreatic insufficiency as well as alcoholic liver cirrhosis, who presented to the emergency room with acute onset of right upper quadrant abdominal pain with associated nausea and hematemesis with bright red blood which have been intermittent over the last 5 days with several episodes per day.    GI bleeding-- could be in the setting of esophageal v varices with history of alcoholic cirrhosis of liver disease and portal hypertension versus rule out peptic ulcer disease - continue him on IV Protonix and octreotide drips. - GI consult with  Dr. Tobi Bastos  -- recommends a EGD bunch better, stable  - CLD -  IV normal saline. - Liver Doppler ultrasound Patent portal vein with appropriate direction of flow. 2.  Cirrhotic liver morphology without focal hepatic lesion. --7/12-- no G.I. bleed reported. Will continue to monitor for now. Clear liquid diet --7/13-- platelet count 50 5K. Hemoglobin 8.2. Discussed with Dr. Mia Creek. Patient is status post endoscopy -- EGD showed- Grade I esophageal varices.                        - Small hiatal hernia.                        - Portal hypertensive gastropathy.                        - Gastritis.                        - Normal examined duodenum.                        - No specimens collected. Recommendation:        - Return patient to hospital ward for ongoing care.                        - Advance diet as tolerated.                        -  Would continue PPI, can switch to PO. Ok to stop                         octreotide. -- Will check hepatic function tomorrow. Advance diet as tolerated. --stop octreotide  Elevated LFTs - This could be related to alcoholic hepatitis and liver cirrhosis.  They are worse than previous levels last year in January.   Hypokalemia - Potassium will be replaced.   Metabolic acidosis - This could be related to alcohol abuse.   Alcohol abuse - on CIWA protocol for possible early withdrawal. -- No signs for withdrawal   Pancreatic insufficiency - due  to alcoholic chronic pancreatitis. - continue his Creon.  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 Orlie Dakin hematology to see patient. Ordered some labs. 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  -- Dr. Orlie Dakin will review CBC with differential. -- Will repeat platelet count and if low consider palliative transfusion since G.I. planning to do EGD -- counts stable after one unit of PR BC and two units  of platelet. Will continue to monitor. --CBC improved --d/c prednisone per Dr Orlie Dakin  Procedures: Family communication :none today Consults : G.I., hematology CODE STATUS: full DVT Prophylaxis : SCD Level of care: Telemetry Medical Status is: Inpatient Remains inpatient appropriate because: GI bleed    TOTAL TIME TAKING CARE OF THIS PATIENT:35 minutes.  >50% time spent on counselling and coordination of care  Note: This dictation was prepared with Dragon dictation along with smaller phrase technology. Any transcriptional errors that result from this process are unintentional.  Enedina Finner M.D    [facility]   CC: Primary care physician; Pcp, No

## 2022-08-27 NOTE — Anesthesia Preprocedure Evaluation (Addendum)
Anesthesia Evaluation  Patient identified by MRN, date of birth, ID band Patient awake    Reviewed: Allergy & Precautions, H&P , NPO status , Patient's Chart, lab work & pertinent test results  Airway Mallampati: III  TM Distance: >3 FB Neck ROM: full    Dental no notable dental hx.    Pulmonary neg pulmonary ROS, Current Smoker and Patient abstained from smoking.   Pulmonary exam normal        Cardiovascular negative cardio ROS Normal cardiovascular exam     Neuro/Psych negative neurological ROS  negative psych ROS   GI/Hepatic ,,,(+) Cirrhosis     substance abuse  alcohol useGI bleeding-- could be in the setting of esophageal v varices with history of alcoholic cirrhosis of liver disease and portal hypertension versus rule out peptic ulcer disease   Endo/Other  negative endocrine ROS    Renal/GU negative Renal ROS  negative genitourinary   Musculoskeletal  (+) Arthritis ,    Abdominal Normal abdominal exam  (+)   Peds  Hematology  (+) Blood dyscrasia, anemia  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 BM suppresison from chronic alcoholism     Anesthesia Other Findings   BMI    Body Mass Index: 28.18 kg/m      Reproductive/Obstetrics negative OB ROS                              Anesthesia Physical Anesthesia Plan  ASA: 3  Anesthesia Plan: General   Post-op Pain Management: Minimal or no pain anticipated   Induction: Intravenous  PONV Risk Score and Plan: Propofol infusion and TIVA  Airway Management Planned: Natural Airway  Additional Equipment:   Intra-op Plan:   Post-operative Plan:   Informed Consent: I have reviewed the patients History and Physical, chart, labs and discussed the procedure including the risks, benefits and alternatives for the proposed anesthesia with the patient or authorized representative who has indicated his/her understanding and acceptance.      Dental Advisory Given  Plan Discussed with: CRNA and Surgeon  Anesthesia Plan Comments:          Anesthesia Quick Evaluation

## 2022-08-27 NOTE — Transfer of Care (Signed)
Immediate Anesthesia Transfer of Care Note  Patient: Stephen Mayer  Procedure(s) Performed: ESOPHAGOGASTRODUODENOSCOPY (EGD) WITH PROPOFOL  Patient Location: PACU and Endoscopy Unit  Anesthesia Type:MAC  Level of Consciousness: drowsy  Airway & Oxygen Therapy: Patient Spontanous Breathing and Patient connected to nasal cannula oxygen  Post-op Assessment: Report given to RN  Post vital signs: stable  Last Vitals:  Vitals Value Taken Time  BP 126/87   Temp 98.48F   Pulse 67   Resp 18   SpO2 97     Last Pain:  Vitals:   08/27/22 1149  TempSrc: Temporal  PainSc: 0-No pain         Complications: No notable events documented.

## 2022-08-27 NOTE — Op Note (Addendum)
[facility] Patient Name: Stephen Mayer Procedure Date: 08/27/2022 11:23 AM MRN: [id number] Account #: [id number] Date of Birth: June 13, 1975 Admit Type: Inpatient Age: 47 Room: [facility] ENDO ROOM 4 Gender: Male Note Status: Supervisor Override Instrument Name: Patton Salles Endoscope [id number] Procedure:             Upper GI endoscopy Indications:           Hematemesis Providers:             Eather Colas MD, MD Medicines:             Monitored Anesthesia Care Complications:         No immediate complications. Procedure:             Pre-Anesthesia Assessment:                        - Prior to the procedure, a History and Physical was                         performed, and patient medications and allergies were                         reviewed. The patient is competent. The risks and                         benefits of the procedure and the sedation options and                         risks were discussed with the patient. All questions                         were answered and informed consent was obtained.                         Patient identification and proposed procedure were                         verified by the physician, the nurse, the                         anesthesiologist, the anesthetist and the technician                         in the endoscopy suite. Mental Status Examination:                         alert and oriented. Airway Examination: normal                         oropharyngeal airway and neck mobility. Respiratory                         Examination: clear to auscultation. CV Examination:                         normal. Prophylactic Antibiotics: The patient does not                         require prophylactic antibiotics. Prior  Anticoagulants: The patient has taken no anticoagulant                         or antiplatelet agents. ASA Grade Assessment: III - A                         patient with severe  systemic disease. After reviewing                         the risks and benefits, the patient was deemed in                         satisfactory condition to undergo the procedure. The                         anesthesia plan was to use monitored anesthesia care                         (MAC). Immediately prior to administration of                         medications, the patient was re-assessed for adequacy                         to receive sedatives. The heart rate, respiratory                         rate, oxygen saturations, blood pressure, adequacy of                         pulmonary ventilation, and response to care were                         monitored throughout the procedure. The physical                         status of the patient was re-assessed after the                         procedure.                        After obtaining informed consent, the endoscope was                         passed under direct vision. Throughout the procedure,                         the patient's blood pressure, pulse, and oxygen                         saturations were monitored continuously. The Endoscope                         was introduced through the mouth, and advanced to the                         second part of duodenum. The upper GI endoscopy was  somewhat difficult due to poor endoscopic                         visualization. The patient tolerated the procedure                         well. Findings:      Possible grade I varices were found in the middle third of the       esophagus. They were diminutive in size. These were somewhat       questionable as there appeared to be none at the [facility] and only       appeared when the esophagus contracted. When esophagus was fully       insufflated, they disappeared. There was no high risk stigmata of       bleeding.      A small hiatal hernia was present.      Mild portal hypertensive gastropathy was found in  the stomach.      Patchy mild inflammation characterized by erythema was found in the       gastric body and in the gastric antrum. The stomach did not fully       insufflate consistently so the fundus was hard to examine but there did       not appear to be any pathology in the fundus.      The examined duodenum was normal. Impression:            - Grade I esophageal varices.                        - Small hiatal hernia.                        - Portal hypertensive gastropathy.                        - Gastritis.                        - Normal examined duodenum.                        - No specimens collected. Recommendation:        - Return patient to hospital ward for ongoing care.                        - Advance diet as tolerated.                        - Would continue PPI, can switch to PO. Ok to stop                         octreotide. Procedure Code(s):     --- Professional ---                        [phone], Esophagogastroduodenoscopy, flexible,                         transoral; diagnostic, including collection of                         specimen(s) by brushing or washing,  when performed                         (separate procedure) Diagnosis Code(s):     --- Professional ---                        I85.00, Esophageal varices without bleeding                        K44.9, Diaphragmatic hernia without obstruction or                         gangrene                        K76.6, Portal hypertension                        K31.89, Other diseases of stomach and duodenum                        K29.70, Gastritis, unspecified, without bleeding                        K92.0, Hematemesis CPT copyright 2022 [employer]. All rights reserved. The codes documented in this report are preliminary and upon coder review may  be revised to meet current compliance requirements. Eather Colas MD, MD 08/27/2022 12:22:54 PM Number of Addenda: 0 Note Initiated On: 08/27/2022 11:23  AM Estimated Blood Loss:  Estimated blood loss: none.      [facility]

## 2022-08-28 NOTE — Anesthesia Postprocedure Evaluation (Signed)
Anesthesia Post Note  Patient: KESTUTIS THORMAHLEN  Procedure(s) Performed: ESOPHAGOGASTRODUODENOSCOPY (EGD) WITH PROPOFOL  Patient location during evaluation: Endoscopy Anesthesia Type: General Level of consciousness: awake and alert Pain management: pain level controlled Vital Signs Assessment: post-procedure vital signs reviewed and stable Respiratory status: spontaneous breathing, nonlabored ventilation and respiratory function stable Cardiovascular status: blood pressure returned to baseline and stable Postop Assessment: no apparent nausea or vomiting Anesthetic complications: no   No notable events documented.   Last Vitals:  Vitals:   08/27/22 1234 08/27/22 1749  BP: (!) 127/98 130/85  Pulse:  (!) 55  Resp: 20 18  Temp:  36.6 C  SpO2: 95% 98%    Last Pain:  Vitals:   08/27/22 1749  TempSrc: Oral  PainSc:                  Foye Deer

## 2022-08-29 ENCOUNTER — Encounter: Payer: Self-pay | Admitting: Gastroenterology

## 2022-08-30 NOTE — Discharge Summary (Signed)
Physician Discharge Summary   Patient: Stephen Mayer MRN: [id number] DOB: November 07, 1975  Admit date:     08/24/2022  Discharge date: Left AMA on 08/27/22  Discharge Physician: Enedina Finner   PCP: Pcp, No   Stephen Mayer is a 47 y.o. male with medical history significant for alcohol use, GERD, osteonecrosis, pancreatic insufficiency as well as alcoholic liver cirrhosis, who presented to the emergency room with acute onset of right upper quadrant abdominal pain with associated nausea and hematemesis with bright red blood which have been intermittent over the last 5 days with several episodes per day.   Patient was placed on alcohol withdrawal protocol. He was also seen by G.I. placed on IV Protonix and octreotide gtt. once platelets count as well as hemoglobin was stable after transfusion G.I. did EGD which showed changes of portal hypertension gastritis. Patient was placed back on diet. However on 13 July in the evening he decided to leave AMA.      Consultants: G.I., hematology  procedures performed: EGD   The results of significant diagnostics from this hospitalization (including imaging, microbiology, ancillary and laboratory) are listed below for reference.   Imaging Studies: US LIVER DOPPLER LIMITED (PV OR SINGLE ART/VEIN)  Result Date: 08/25/2022 CLINICAL DATA:  Right upper quadrant pain Cirrhosis EXAM: DUPLEX ULTRASOUND OF LIVER TECHNIQUE: Color and duplex Doppler ultrasound was performed to evaluate the hepatic in-flow and out-flow vessels. COMPARISON:  CT abdomen pelvis 08/24/2022 FINDINGS: Liver: Diffuse heterogeneity of hepatic parenchyma. Nodular contours. No focal hepatic lesion. Main Portal Vein size: 1.0 cm Portal Vein Velocities Main Prox:  24 cm/sec Main Mid: 27 cm/sec Main Dist:  16 cm/sec Right: 30 cm/sec Left: 21 cm/sec Portal vein only imaging per request. IMPRESSION: 1. Patent portal vein with appropriate direction of flow. 2.  Cirrhotic liver morphology without focal  hepatic lesion. Electronically Signed   By: Acquanetta Belling M.D.   On: 08/25/2022 13:19   CT ABDOMEN PELVIS W CONTRAST  Result Date: 08/24/2022 CLINICAL DATA:  Abdominal pain, acute, nonlocalized EXAM: CT ABDOMEN AND PELVIS WITH CONTRAST TECHNIQUE: Multidetector CT imaging of the abdomen and pelvis was performed using the standard protocol following bolus administration of intravenous contrast. RADIATION DOSE REDUCTION: This exam was performed according to the departmental dose-optimization program which includes automated exposure control, adjustment of the mA and/or kV according to patient size and/or use of iterative reconstruction technique. CONTRAST:  OMNIPAQUE IOHEXOL 300 MG/ML  SOLN COMPARISON:  CT dated November 16, 2005. FINDINGS: Lower chest: No acute abnormality. Hepatobiliary: Hepatic steatosis. Nodular liver contour. Recanalized paraumbilical vein. Main portal vein is grossly patent with multiple adjacent collaterals in the porta hepatis as well as additional portosystemic shunts. Multiple areas of stellate appearing enhancement likely reflecting fibrosis. Pancreas: Multiple coarse calcifications of the pancreas consistent with sequela of chronic pancreatitis. No peripancreatic fat stranding. Spleen: Splenomegaly. Multiple distended adjacent splenic collateral vessels. Adrenals/Urinary Tract: Adrenal glands are unremarkable. Multiple nonobstructing bilateral nephrolithiasis. No obstructing nephrolithiasis. No hydronephrosis. Evaluation of the ureterovesicular junction is limited due to streak artifact from hip arthroplasty. Bladder is unremarkable. Stomach/Bowel: No evidence of bowel obstruction. Moderate hiatal hernia. Prominence of the stomach folds. Appendix is normal. Vascular/Lymphatic: Abdominal aorta is normal in caliber. No visualized lymphadenopathy. Retroaortic LEFT renal vein. Reproductive: Prostate is obscured by streak artifact. Other: No free air or free fluid. Musculoskeletal: Status  post bilateral hip arthroplasty. IMPRESSION: 1. Cirrhosis with sequela of portal hypertension including splenomegaly and multiple portosystemic shunts. 2. Prominence of the stomach folds. This  could reflect gastritis in the appropriate clinical setting. 3. Moderate hiatal hernia. 4. Nonobstructing bilateral nephrolithiasis. 5. Sequela of chronic pancreatitis. Electronically Signed   By: Meda Klinefelter M.D.   On: 08/24/2022 20:08   DG Chest 2 View  Result Date: 08/24/2022 CLINICAL DATA:  upper GI bleed EXAM: CHEST - 2 VIEW COMPARISON:  None Available. FINDINGS: The cardiomediastinal silhouette is normal in contour. No pleural effusion. No pneumothorax. No acute pleuroparenchymal abnormality. Visualized abdomen is unremarkable. No acute osseous abnormality noted. IMPRESSION: No acute cardiopulmonary abnormality. Electronically Signed   By: Meda Klinefelter M.D.   On: 08/24/2022 19:24   US ABDOMEN LIMITED RUQ (LIVER/GB)  Result Date: 08/24/2022 CLINICAL DATA:  Pain EXAM: ULTRASOUND ABDOMEN LIMITED RIGHT UPPER QUADRANT COMPARISON:  None Available. FINDINGS: Gallbladder: No gallstones or wall thickening visualized. No sonographic Murphy sign noted by sonographer. Common bile duct: Not able to visualized to overlying bowel gas. Liver: Evaluation is limited to decreased penetration, Underlying mass can not be excluded given exam limitations. Nodular and coarse echotexture parenchymal echogenicity. Portal vein is not visualized due to overlying bowel bowel-gas. Other: None. IMPRESSION: 1. Markedly limited exam due to patient body habitus and overlying bowel gas. 2. Nodular and coarse echotexture of the liver parenchyma, suggestive of cirrhosis. Underlying mass can not be excluded given exam limitations. 3. Bile ducts and portal vein are not visualized due to exam limitations. 4. Visualized gallbladder is unremarkable. Electronically Signed   By: Allegra Lai M.D.   On: 08/24/2022 19:01     Microbiology: Results for orders placed or performed during the hospital encounter of 02/16/21  Resp Panel by RT-PCR (Flu A&B, Covid) Nasopharyngeal Swab     Status: None   Collection Time: 02/16/21 10:04 PM   Specimen: Nasopharyngeal Swab; Nasopharyngeal(NP) swabs in vial transport medium  Result Value Ref Range Status   SARS Coronavirus 2 by RT PCR NEGATIVE NEGATIVE Final    Comment: (NOTE) SARS-CoV-2 target nucleic acids are NOT DETECTED.  The SARS-CoV-2 RNA is generally detectable in upper respiratory specimens during the acute phase of infection. The lowest concentration of SARS-CoV-2 viral copies this assay can detect is 138 copies/mL. A negative result does not preclude SARS-Cov-2 infection and should not be used as the sole basis for treatment or other patient management decisions. A negative result may occur with  improper specimen collection/handling, submission of specimen other than nasopharyngeal swab, presence of viral mutation(s) within the areas targeted by this assay, and inadequate number of viral copies(<138 copies/mL). A negative result must be combined with clinical observations, patient history, and epidemiological information. The expected result is Negative.  Fact Sheet for Patients:  BloggerCourse.com  Fact Sheet for Healthcare Providers:  SeriousBroker.it  This test is no t yet approved or cleared by the [place] FDA and  has been authorized for detection and/or diagnosis of SARS-CoV-2 by FDA under an Emergency Use Authorization (EUA). This EUA will remain  in effect (meaning this test can be used) for the duration of the COVID-19 declaration under Section 564(b)(1) of the Act, 21 U.S.C.section 360bbb-3(b)(1), unless the authorization is terminated  or revoked sooner.       Influenza A by PCR NEGATIVE NEGATIVE Final   Influenza B by PCR NEGATIVE NEGATIVE Final    Comment: (NOTE) The Xpert  Xpress SARS-CoV-2/FLU/RSV plus assay is intended as an aid in the diagnosis of influenza from Nasopharyngeal swab specimens and should not be used as a sole basis for treatment. Nasal washings and aspirates are unacceptable for  Xpert Xpress SARS-CoV-2/FLU/RSV testing.  Fact Sheet for Patients: BloggerCourse.com  Fact Sheet for Healthcare Providers: SeriousBroker.it  This test is not yet approved or cleared by the [place] FDA and has been authorized for detection and/or diagnosis of SARS-CoV-2 by FDA under an Emergency Use Authorization (EUA). This EUA will remain in effect (meaning this test can be used) for the duration of the COVID-19 declaration under Section 564(b)(1) of the Act, 21 U.S.C. section 360bbb-3(b)(1), unless the authorization is terminated or revoked.  Performed at [facility], [address] [place], [place] [address]     Labs: CBC: Recent Labs  Lab 08/24/22 1348 08/25/22 1451 08/26/22 0643 08/27/22 0809  WBC 3.4* 2.0* 2.9* 4.2  NEUTROABS  --  1.3*  --   --   HGB 8.1* 7.4* 8.3* 8.2*  HCT 26.8* 25.1* 27.9* 26.6*  MCV 73.2* 74.9* 77.1* 75.8*  PLT 32* 19* 41* 55*   Basic Metabolic Panel: Recent Labs  Lab 08/24/22 1348  NA 141  K 3.4*  CL 110  CO2 18*  GLUCOSE 101*  BUN 10  CREATININE 0.78  CALCIUM 8.1*   Liver Function Tests: Recent Labs  Lab 08/24/22 1348  AST 226*  ALT 82*  ALKPHOS 168*  BILITOT 8.5*  PROT 7.4  ALBUMIN 3.3*   CBG: No results for input(s): "GLUCAP" in the last 168 hours.  Discharge time spent: less than 30 minutes.  Signed: Enedina Finner, MD [facility] 08/30/2022

## 2022-09-04 ENCOUNTER — Other Ambulatory Visit: Payer: Self-pay

## 2022-09-04 ENCOUNTER — Inpatient Hospital Stay: Payer: BLUE CROSS/BLUE SHIELD

## 2022-09-04 ENCOUNTER — Encounter: Payer: Self-pay | Admitting: Emergency Medicine

## 2022-09-04 ENCOUNTER — Inpatient Hospital Stay
Admission: EM | Admit: 2022-09-04 | Discharge: 2022-09-13 | DRG: 432 | Discharge status: Against Medical Advice | Payer: Self-pay | Attending: Internal Medicine | Admitting: Internal Medicine

## 2022-09-04 DIAGNOSIS — B179 Acute viral hepatitis, unspecified: Secondary | ICD-10-CM | POA: Diagnosis present

## 2022-09-04 DIAGNOSIS — R7989 Other specified abnormal findings of blood chemistry: Secondary | ICD-10-CM | POA: Diagnosis present

## 2022-09-04 DIAGNOSIS — K449 Diaphragmatic hernia without obstruction or gangrene: Secondary | ICD-10-CM | POA: Diagnosis present

## 2022-09-04 DIAGNOSIS — Z79899 Other long term (current) drug therapy: Secondary | ICD-10-CM

## 2022-09-04 DIAGNOSIS — D696 Thrombocytopenia, unspecified: Secondary | ICD-10-CM | POA: Diagnosis present

## 2022-09-04 DIAGNOSIS — K922 Gastrointestinal hemorrhage, unspecified: Secondary | ICD-10-CM | POA: Diagnosis present

## 2022-09-04 DIAGNOSIS — F1010 Alcohol abuse, uncomplicated: Secondary | ICD-10-CM | POA: Diagnosis present

## 2022-09-04 DIAGNOSIS — K766 Portal hypertension: Secondary | ICD-10-CM | POA: Diagnosis present

## 2022-09-04 DIAGNOSIS — R17 Unspecified jaundice: Secondary | ICD-10-CM

## 2022-09-04 DIAGNOSIS — F17210 Nicotine dependence, cigarettes, uncomplicated: Secondary | ICD-10-CM | POA: Diagnosis present

## 2022-09-04 DIAGNOSIS — G9341 Metabolic encephalopathy: Secondary | ICD-10-CM | POA: Diagnosis present

## 2022-09-04 DIAGNOSIS — E871 Hypo-osmolality and hyponatremia: Secondary | ICD-10-CM | POA: Diagnosis present

## 2022-09-04 DIAGNOSIS — K7010 Alcoholic hepatitis without ascites: Principal | ICD-10-CM | POA: Diagnosis present

## 2022-09-04 DIAGNOSIS — K7030 Alcoholic cirrhosis of liver without ascites: Secondary | ICD-10-CM | POA: Diagnosis present

## 2022-09-04 DIAGNOSIS — K7682 Hepatic encephalopathy: Secondary | ICD-10-CM | POA: Diagnosis present

## 2022-09-04 DIAGNOSIS — R7401 Elevation of levels of liver transaminase levels: Secondary | ICD-10-CM

## 2022-09-04 DIAGNOSIS — K219 Gastro-esophageal reflux disease without esophagitis: Secondary | ICD-10-CM | POA: Diagnosis present

## 2022-09-04 DIAGNOSIS — Z5329 Procedure and treatment not carried out because of patient's decision for other reasons: Secondary | ICD-10-CM | POA: Diagnosis present

## 2022-09-04 DIAGNOSIS — E872 Acidosis, unspecified: Secondary | ICD-10-CM | POA: Diagnosis present

## 2022-09-04 DIAGNOSIS — E876 Hypokalemia: Secondary | ICD-10-CM | POA: Diagnosis present

## 2022-09-04 DIAGNOSIS — L299 Pruritus, unspecified: Secondary | ICD-10-CM | POA: Diagnosis present

## 2022-09-04 DIAGNOSIS — I8510 Secondary esophageal varices without bleeding: Secondary | ICD-10-CM | POA: Diagnosis present

## 2022-09-04 DIAGNOSIS — K3189 Other diseases of stomach and duodenum: Secondary | ICD-10-CM | POA: Diagnosis present

## 2022-09-04 LAB — COMPREHENSIVE METABOLIC PANEL
ALT: 115 U/L — ABNORMAL HIGH (ref 0–44)
AST: 238 U/L — ABNORMAL HIGH (ref 15–41)
Albumin: 3.1 g/dL — ABNORMAL LOW (ref 3.5–5.0)
Alkaline Phosphatase: 140 U/L — ABNORMAL HIGH (ref 38–126)
Anion gap: 11 (ref 5–15)
BUN: 13 mg/dL (ref 6–20)
CO2: 16 mmol/L — ABNORMAL LOW (ref 22–32)
Calcium: 9.1 mg/dL (ref 8.9–10.3)
Chloride: 107 mmol/L (ref 98–111)
Creatinine, Ser: 0.68 mg/dL (ref 0.61–1.24)
GFR, Estimated: >60 mL/min (ref >60)
Glucose, Bld: 115 mg/dL — ABNORMAL HIGH (ref 70–99)
Potassium: 3.2 mmol/L — ABNORMAL LOW (ref 3.5–5.1)
Sodium: 134 mmol/L — ABNORMAL LOW (ref 135–145)
Total Bilirubin: 31.1 mg/dL (ref 0.3–1.2)
Total Protein: 7.9 g/dL (ref 6.5–8.1)

## 2022-09-04 LAB — CBC
HCT: 31.8 % — ABNORMAL LOW (ref 39.0–52.0)
Hemoglobin: 10.4 g/dL — ABNORMAL LOW (ref 13.0–17.0)
MCH: 24.3 pg — ABNORMAL LOW (ref 26.0–34.0)
MCHC: 32.7 g/dL (ref 30.0–36.0)
MCV: 74.3 fL — ABNORMAL LOW (ref 80.0–100.0)
Platelets: 205 10*3/uL (ref 150–400)
RBC: 4.28 MIL/uL (ref 4.22–5.81)
RDW: 30.6 % — ABNORMAL HIGH (ref 11.5–15.5)
WBC: 7.1 10*3/uL (ref 4.0–10.5)
nRBC: 0 % (ref 0.0–0.2)

## 2022-09-04 LAB — APTT: aPTT: 35 seconds (ref 24–36)

## 2022-09-04 LAB — ETHANOL: Alcohol, Ethyl (B): <10 mg/dL (ref <10)

## 2022-09-04 LAB — HEPATITIS PANEL, ACUTE
HCV Ab: REACTIVE — AB
Hep A IgM: NONREACTIVE
Hep B C IgM: NONREACTIVE
Hepatitis B Surface Ag: NONREACTIVE

## 2022-09-04 LAB — BLOOD GAS, VENOUS
Acid-base deficit: 3.9 mmol/L — ABNORMAL HIGH (ref 0.0–2.0)
Bicarbonate: 19.5 mmol/L — ABNORMAL LOW (ref 20.0–28.0)
O2 Saturation: 94.9 %
Patient temperature: 37
pCO2, Ven: 30 mmHg — ABNORMAL LOW (ref 44–60)
pH, Ven: 7.42 (ref 7.25–7.43)
pO2, Ven: 68 mmHg — ABNORMAL HIGH (ref 32–45)

## 2022-09-04 LAB — PROTIME-INR
INR: 1.2 (ref 0.8–1.2)
Prothrombin Time: 15.3 seconds — ABNORMAL HIGH (ref 11.4–15.2)

## 2022-09-04 LAB — BILIRUBIN, DIRECT: Bilirubin, Direct: 19.7 mg/dL — ABNORMAL HIGH (ref 0.0–0.2)

## 2022-09-04 LAB — ACETAMINOPHEN LEVEL: Acetaminophen (Tylenol), Serum: <10 ug/mL — ABNORMAL LOW (ref 10–30)

## 2022-09-04 LAB — MAGNESIUM: Magnesium: 2 mg/dL (ref 1.7–2.4)

## 2022-09-04 LAB — AMMONIA: Ammonia: 44 umol/L — ABNORMAL HIGH (ref 9–35)

## 2022-09-04 MED ORDER — PANTOPRAZOLE SODIUM 40 MG IV SOLR
40.0000 mg | Freq: Two times a day (BID) | INTRAVENOUS | Status: DC
  Administered 2022-09-04 – 2022-09-05 (×2): 40 mg via INTRAVENOUS
  Filled 2022-09-04 (×2): qty 10

## 2022-09-04 MED ORDER — THIAMINE HCL 100 MG/ML IJ SOLN
100.0000 mg | Freq: Every day | INTRAMUSCULAR | Status: DC
  Administered 2022-09-13: 100 mg via INTRAVENOUS
  Filled 2022-09-04: qty 2

## 2022-09-04 MED ORDER — POTASSIUM CHLORIDE 10 MEQ/100ML IV SOLN
10.0000 meq | INTRAVENOUS | Status: AC
  Administered 2022-09-04 – 2022-09-05 (×3): 10 meq via INTRAVENOUS
  Filled 2022-09-04 (×2): qty 100

## 2022-09-04 MED ORDER — ADULT MULTIVITAMIN W/MINERALS CH
1.0000 | ORAL_TABLET | Freq: Every day | ORAL | Status: DC
  Administered 2022-09-04 – 2022-09-13 (×10): 1 via ORAL
  Filled 2022-09-04 (×10): qty 1

## 2022-09-04 MED ORDER — SODIUM CHLORIDE 0.9 % IV SOLN: INTRAVENOUS | Status: DC

## 2022-09-04 MED ORDER — ENOXAPARIN SODIUM 40 MG/0.4ML IJ SOSY
40.0000 mg | PREFILLED_SYRINGE | INTRAMUSCULAR | Status: DC
  Administered 2022-09-04 – 2022-09-12 (×9): 40 mg via SUBCUTANEOUS
  Filled 2022-09-04 (×9): qty 0.4

## 2022-09-04 MED ORDER — ONDANSETRON HCL 4 MG/2ML IJ SOLN: 4.0000 mg | Freq: Four times a day (QID) | INTRAMUSCULAR | Status: DC | PRN

## 2022-09-04 MED ORDER — LORAZEPAM 1 MG PO TABS: 1.0000 mg | ORAL_TABLET | ORAL | Status: AC | PRN

## 2022-09-04 MED ORDER — FOLIC ACID 1 MG PO TABS
1.0000 mg | ORAL_TABLET | Freq: Every day | ORAL | Status: DC
  Administered 2022-09-04 – 2022-09-13 (×10): 1 mg via ORAL
  Filled 2022-09-04 (×10): qty 1

## 2022-09-04 MED ORDER — ONDANSETRON HCL 4 MG PO TABS: 4.0000 mg | ORAL_TABLET | Freq: Four times a day (QID) | ORAL | Status: DC | PRN

## 2022-09-04 MED ORDER — THIAMINE MONONITRATE 100 MG PO TABS
100.0000 mg | ORAL_TABLET | Freq: Every day | ORAL | Status: DC
  Administered 2022-09-04 – 2022-09-12 (×9): 100 mg via ORAL
  Filled 2022-09-04 (×10): qty 1

## 2022-09-04 MED ORDER — LACTULOSE 10 GM/15ML PO SOLN
30.0000 g | Freq: Three times a day (TID) | ORAL | Status: DC
  Administered 2022-09-04: 30 g via ORAL
  Filled 2022-09-04: qty 60

## 2022-09-04 NOTE — H&P (Signed)
History and Physical    Patient: Stephen Mayer NWG:[id number] DOB: [birth year] DOA: 09/04/2022 DOS: the patient was seen and examined on 09/04/2022 PCP: Pcp, No  Patient coming from: Home  Chief Complaint:  Chief Complaint  Patient presents with   Jaundice   HPI: PER BEAGLEY is a 47 y.o. male with medical history significant of alcohol abuse, GERD, osteonecrosis, pancreatic insufficiency, liver cirrhosis presenting with jaundice.  Patient noted to have been admitted July 10 July 13 for GI bleeding.  Patient left the hospital AGAINST MEDICAL ADVICE.  Patient reports worsening jaundice at home.  Minimal abdominal pain no reported nausea or vomiting.  States that bloody bowel movements have resolved.  States he quit drinking from last admission.  Does not specify what or how much he was drinking.  No chest pain or shortness of breath.  No hemiparesis or confusion. Presented to the ER afebrile, hemodynamically stable.  White count 7, hemoglobin 10.4, platelets 205, creatinine 0.68, T. bili of 31, AST 238, ALT 115, alk phos 140.  Direct bilirubin of 20. Review of Systems: As mentioned in the history of present illness. All other systems reviewed and are negative. Past Medical History:  Diagnosis Date   Arthritis    Past Surgical History:  Procedure Laterality Date   ESOPHAGOGASTRODUODENOSCOPY (EGD) WITH PROPOFOL N/A 08/27/2022   Procedure: ESOPHAGOGASTRODUODENOSCOPY (EGD) WITH PROPOFOL;  Surgeon: Regis Bill, MD;  Location: [facility] ENDOSCOPY;  Service: Endoscopy;  Laterality: N/A;   Social History:  reports that he has been smoking cigarettes. He has never used smokeless tobacco. He reports current alcohol use. He reports current drug use.  No Known Allergies  History reviewed. No pertinent family history.  Prior to Admission medications   Medication Sig Start Date End Date Taking? Authorizing Provider  furosemide (LASIX) 20 MG tablet Take 1 tablet (20 mg total) by mouth daily  for 5 days. 06/22/22 06/27/22  Chesley Noon, MD  Omeprazole Magnesium (PRILOSEC OTC PO) Take 20 mg by mouth daily.    [provider]    Physical Exam: Vitals:   09/04/22 1157 09/04/22 1159  BP:  127/72  Pulse:  96  Resp:  18  Temp:  98.4 F (36.9 C)  TempSrc:  Oral  SpO2:  97%  Weight: 81.6 kg   Height: 5\' 7"  (1.702 m)    Physical Exam Constitutional:      Appearance: He is normal weight.  HENT:     Head: Normocephalic.     Nose: Nose normal.  Eyes:     General: Scleral icterus present.  Cardiovascular:     Rate and Rhythm: Normal rate and regular rhythm.  Pulmonary:     Effort: Pulmonary effort is normal.  Abdominal:     General: Bowel sounds are normal.  Musculoskeletal:        General: Normal range of motion.  Skin:    Coloration: Skin is jaundiced.  Neurological:     General: No focal deficit present.  Psychiatric:        Mood and Affect: Mood normal.     Data Reviewed:  There are no new results to review at this time. US LIVER DOPPLER LIMITED (PV OR SINGLE ART/VEIN) CLINICAL DATA:  Right upper quadrant pain  Cirrhosis  EXAM: DUPLEX ULTRASOUND OF LIVER  TECHNIQUE: Color and duplex Doppler ultrasound was performed to evaluate the hepatic in-flow and out-flow vessels.  COMPARISON:  CT abdomen pelvis 08/24/2022  FINDINGS: Liver: Diffuse heterogeneity of hepatic parenchyma. Nodular contours. No focal  hepatic lesion.  Main Portal Vein size: 1.0 cm  Portal Vein Velocities  Main Prox:  24 cm/sec  Main Mid: 27 cm/sec  Main Dist:  16 cm/sec Right: 30 cm/sec Left: 21 cm/sec  Portal vein only imaging per request.  IMPRESSION: 1. Patent portal vein with appropriate direction of flow. 2.  Cirrhotic liver morphology without focal hepatic lesion.  Electronically Signed   By: Acquanetta Belling M.D.   On: 08/25/2022 13:19  Lab Results  Component Value Date   WBC 7.1 09/04/2022   HGB 10.4 (L) 09/04/2022   HCT 31.8 (L) 09/04/2022   MCV  74.3 (L) 09/04/2022   PLT 205 09/04/2022   Last metabolic panel Lab Results  Component Value Date   GLUCOSE 115 (H) 09/04/2022   NA 134 (L) 09/04/2022   K 3.2 (L) 09/04/2022   CL 107 09/04/2022   CO2 16 (L) 09/04/2022   BUN 13 09/04/2022   CREATININE 0.68 09/04/2022   GFRNONAA >60 09/04/2022   CALCIUM 9.1 09/04/2022   PROT 7.9 09/04/2022   ALBUMIN 3.1 (L) 09/04/2022   BILITOT 31.1 (HH) 09/04/2022   ALKPHOS 140 (H) 09/04/2022   AST 238 (H) 09/04/2022   ALT 115 (H) 09/04/2022   ANIONGAP 11 09/04/2022    Assessment and Plan: Hyperbilirubinemia Bilirubin level of 8-->31 in the setting of severe alcohol use Direct bilirubin at 20 Suspect secondary to severe alcohol use CT abdomen pelvis as well as right upper quadrant ultrasound July 10 grossly stable apart from portal hypertension Likely secondary to ETOH abuse/cirrhotic disease  Pending formal GI consult     Elevated LFTs AST 238, ALT 115, T. bili 31 in the setting of alcoholic disease  Positive cirrhotic changes on July 10 CT of the abdomen as well as right upper quadrant ultrasound Hepatitis panel pending MELD score of 24 Maddrey Score 31.5 ? Glucocorticoid therapy  Follow-up gastroenterology recommendations   Alcohol abuse Pt reports prior heavy ETOH use.  Recently quit and will not specify use  ETOH level pending  CIWA protocol  Follow   GI bleeding Admission 7/10 through 7/13 with patient leaving AMA Patient denies any recurrence of GI bleeding Hemoglobin improved from 8.2-10.4.      Advance Care Planning:   Code Status: Full Code   Consults: Gastroenterology   Family Communication: No family at the bedside   Severity of Illness: The appropriate patient status for this patient is INPATIENT. Inpatient status is judged to be reasonable and necessary in order to provide the required intensity of service to ensure the patient's safety. The patient's presenting symptoms, physical exam findings, and  initial radiographic and laboratory data in the context of their chronic comorbidities is felt to place them at high risk for further clinical deterioration. Furthermore, it is not anticipated that the patient will be medically stable for discharge from the hospital within 2 midnights of admission.   * I certify that at the point of admission it is my clinical judgment that the patient will require inpatient hospital care spanning beyond 2 midnights from the point of admission due to high intensity of service, high risk for further deterioration and high frequency of surveillance required.*  Author: Floydene Flock, MD 09/04/2022 3:47 PM  For on call review [email or web address].

## 2022-09-04 NOTE — TOC Progression Note (Signed)
Transition of Care [facility]) - Progression Note    Patient Details  Name: Stephen Mayer MRN: [id number] Date of Birth: 1975/04/01  Transition of Care [facility]) CM/SW Contact  Colette Ribas, [place] Phone Number: 09/04/2022, 2:35 PM  Clinical Narrative:     CSW added Substance abuse resources to AVS. Merrily Brittle TOC-Weekends [phone]         Expected Discharge Plan and Services                                               Social Determinants of Health (SDOH) Interventions SDOH Screenings   Food Insecurity: No Food Insecurity (08/25/2022)  Housing: Low Risk  (08/25/2022)  Transportation Needs: No Transportation Needs (08/25/2022)  Utilities: Not At Risk (08/25/2022)  Tobacco Use: High Risk (09/04/2022)    Readmission Risk Interventions     No data to display

## 2022-09-04 NOTE — Assessment & Plan Note (Signed)
Pt reports prior heavy ETOH use.  Recently quit and will not specify use  ETOH level pending  CIWA protocol  Follow

## 2022-09-04 NOTE — Assessment & Plan Note (Addendum)
Hematemesis - Admission 7/10 through 7/13 with patient signed out AMA Patient denies any recurrence of GI bleeding since.  EGD on 08/27/2022 showed portal hypertensive gastropathy, gastritis, small hiatal hernia and grade 1 esophageal varices.  --Hbg stable.  Monitor.

## 2022-09-04 NOTE — Assessment & Plan Note (Addendum)
See acute hepatitis

## 2022-09-04 NOTE — ED Provider Notes (Signed)
[facility] Provider Note    Event Date/Time   First MD Initiated Contact with Patient 09/04/22 1301     (approximate)   History   Jaundice   HPI  Stephen Mayer is a 47 y.o. male   Past medical history of alcohol use, GERD, pancreatic insufficiency and alcoholic liver cirrhosis presents to the emerged department with confusion, hallucinations, jaundice and yellowing of the eyes over the last 1 day.  Of note, he was admitted to the hospital last week and left AMA on 08/27/2022 at that time had GI bleeding and cirrhotic liver.  He no longer has GI bleeding.  He says he stopped drinking ever since his discharge in 08/27/2022.  He continues to have unchanged right upper quadrant pain.  His new symptoms started yesterday including yellowing of the skin and the eyes.  He also states that he has been hallucinating hearing things that are not heard by the others and confusion.   External Medical Documents Reviewed: Discharge summary dated 08/27/2022 for GI bleeding, found to have portal hypertension gastritis on EGD (and grade 1 esophageal varices) , and left AMA      Physical Exam   Triage Vital Signs: ED Triage Vitals  Encounter Vitals Group     BP 09/04/22 1159 127/72     Systolic BP Percentile --      Diastolic BP Percentile --      Pulse Rate 09/04/22 1159 96     Resp 09/04/22 1159 18     Temp 09/04/22 1159 98.4 F (36.9 C)     Temp Source 09/04/22 1159 Oral     SpO2 09/04/22 1159 97 %     Weight 09/04/22 1157 180 lb (81.6 kg)     Height 09/04/22 1157 5\' 7"  (1.702 m)     Head Circumference --      Peak Flow --      Pain Score 09/04/22 1156 5     Pain Loc --      Pain Education --      Exclude from Growth Chart --     Most recent vital signs: Vitals:   09/04/22 1159  BP: 127/72  Pulse: 96  Resp: 18  Temp: 98.4 F (36.9 C)  SpO2: 97%    General: Awake, no distress.  CV:  Good peripheral perfusion.  Resp:  Normal effort.   Abd:  No distention.  Other:  Awake alert and oriented comfortable appearing pleasant gentleman with normal hemodynamics and afebrile.  He does have jaundice.  He does have scleral icterus.  He has no asterixis.  He has mild tenderness to the right upper quadrant.  He has no rigidity or guarding in his abdomen.   ED Results / Procedures / Treatments   Labs (all labs ordered are listed, but only abnormal results are displayed) Labs Reviewed  CBC - Abnormal; Notable for the following components:      Result Value   Hemoglobin 10.4 (*)    HCT 31.8 (*)    MCV 74.3 (*)    MCH 24.3 (*)    RDW 30.6 (*)    All other components within normal limits  COMPREHENSIVE METABOLIC PANEL - Abnormal; Notable for the following components:   Sodium 134 (*)    Potassium 3.2 (*)    CO2 16 (*)    Glucose, Bld 115 (*)    Albumin 3.1 (*)    AST 238 (*)    ALT 115 (*)  Alkaline Phosphatase 140 (*)    Total Bilirubin 31.1 (*)    All other components within normal limits  AMMONIA  PROTIME-INR  APTT  HEPATITIS PANEL, ACUTE  BILIRUBIN, FRACTIONATED(TOT/DIR/INDIR)     I ordered and reviewed the above labs they are notable for his hemoglobin is 10.4 improved from the eights in the last admission, no leukocytosis but he does have stably elevated AST and LFT in 2:1 ratio 200s to 100s, and a markedly increased total bilirubin at 31 compared to 8 during last admission, decreased albumin at 3.1    PROCEDURES:  Critical Care performed: No  Procedures   MEDICATIONS ORDERED IN ED: Medications  LORazepam (ATIVAN) tablet 1-4 mg (has no administration in time range)  thiamine (VITAMIN B1) tablet 100 mg (has no administration in time range)    Or  thiamine (VITAMIN B1) injection 100 mg (has no administration in time range)  folic acid (FOLVITE) tablet 1 mg (has no administration in time range)  multivitamin with minerals tablet 1 tablet (has no administration in time range)  lactulose (CHRONULAC) 10  GM/15ML solution 30 g (has no administration in time range)    External physician / consultants:  I spoke with hospitalist regarding care plan for this patient.   IMPRESSION / MDM / ASSESSMENT AND PLAN / ED COURSE  I reviewed the triage vital signs and the nursing notes.                                Patient's presentation is most consistent with acute presentation with potential threat to life or bodily function.  Differential diagnosis includes, but is not limited to, liver failure, alcoholic cirrhosis, hepatic encephalopathy   The patient is on the cardiac monitor to evaluate for evidence of arrhythmia and/or significant heart rate changes.  MDM:    Is a patient with alcoholic cirrhosis with concern for worsening liver failure in the setting of jaundice, scleral icterus, confusion and hallucinations that are new within the last 1 day.  His bilirubin is markedly elevated, his LFTs are stable and high, platelets have improved, no further bleeding and stable H&H.  I ordered for lactulose 3 times daily for concern for hepatic encephalopathy, and added on a pneumonia, coagulation studies to his basic labs ordered from triage.  Admission.  He states that he no longer drinks and he is approaching 2 weeks from his last drink per patient report and does not appear to be acutely withdrawing.  I put him on CIWA protocol      FINAL CLINICAL IMPRESSION(S) / ED DIAGNOSES   Final diagnoses:  Alcoholic cirrhosis, unspecified whether ascites present (HCC)  Hepatic encephalopathy (HCC)  Jaundice     Rx / DC Orders   ED Discharge Orders     None        Note:  This document was prepared using Dragon voice recognition software and may include unintentional dictation errors.    Pilar Jarvis, MD 09/04/22 1341

## 2022-09-04 NOTE — ED Notes (Signed)
Pt difficult stick. RN was able to get a 20g in the RAC but not able to get any blood. RN notified phlebotomy.

## 2022-09-04 NOTE — ED Notes (Signed)
CRITICAL VALUE STICKER  CRITICAL VALUE: total bilirubin 31.1  RECEIVER (on-site recipient of call):Adyn Serna   DATE & TIME NOTIFIED: 09/04/22 1244  MESSENGER (representative from lab):lab  MD NOTIFIED: Dr. Erma Heritage  TIME OF NOTIFICATION:1244  RESPONSE:

## 2022-09-04 NOTE — Consult Note (Signed)
Wyline Mood , MD [address], [address], [place], [place], [address] [address], [place], [place], [address] Phone: [phone]  Fax: [phone]  Consultation  Referring Provider: Dr Alvester Morin Primary Care Physician:  Pcp, No Primary Gastroenterologist:  Dr. Tobi Bastos         Reason for Consultation:     Abnormal LFT's   Date of Admission:  09/04/2022 Date of Consultation:  09/04/2022         HPI:   Stephen Mayer is a 47 y.o. male who was seen by myself during recent hospitalization on 08/25/2022.  When he was admitted with hematemesis after an episode of gagging.  Found to have low platelet count hepatitis C antibody was positive active consumption of alcohol.EGD on 08/27/2022 which showed a small hiatal hernia portal hypertensive gastropathy grade 1 esophageal varices, subsequently he decided to leave AMA.   Present emergency department confusion hallucinations jaundice yellowing of the eyes for the past 1 day.  In the ER his hemoglobin was 10.5 g up from 8.2 g a day has been, AST of 238 ALT of 115 alkaline phosphatase 140 total bilirubin of 31.1 direct component of 19.7 INR 1.2 acute hepatitis panel in progress.  08/25/2022 ferritin of 37 B12 1159 folate normal.  No abdominal imaging. Denies any new medications, illegal drug use, new tatoos, no alcohol , no tylenol use. No abdominal pain or fever. Did feel confused earlier but ok now. Remote use of illegal drugs and prior tatoos were professional he states.   Past Medical History:  Diagnosis Date   Arthritis     Past Surgical History:  Procedure Laterality Date   ESOPHAGOGASTRODUODENOSCOPY (EGD) WITH PROPOFOL N/A 08/27/2022   Procedure: ESOPHAGOGASTRODUODENOSCOPY (EGD) WITH PROPOFOL;  Surgeon: Regis Bill, MD;  Location: [facility] ENDOSCOPY;  Service: Endoscopy;  Laterality: N/A;    Prior to Admission medications   Medication Sig Start Date End Date Taking? Authorizing Provider  diphenhydrAMINE (BENADRYL) 25 mg  capsule Take 25 mg by mouth every 6 (six) hours as needed for itching.   Yes [provider]  Omeprazole Magnesium (PRILOSEC OTC PO) Take 20 mg by mouth daily.   Yes [provider]  furosemide (LASIX) 20 MG tablet Take 1 tablet (20 mg total) by mouth daily for 5 days. Patient not taking: Reported on 09/04/2022 06/22/22 06/27/22  Chesley Noon, MD    History reviewed. No pertinent family history.   Social History   Tobacco Use   Smoking status: Every Day    Types: Cigarettes   Smokeless tobacco: Never  Vaping Use   Vaping status: Never Used  Substance Use Topics   Alcohol use: Yes   Drug use: Yes    Allergies as of 09/04/2022   (No Known Allergies)    Review of Systems:    All systems reviewed and negative except where noted in HPI.   Physical Exam:  Vital signs in last 24 hours: Temp:  [98.1 F (36.7 C)-98.4 F (36.9 C)] 98.1 F (36.7 C) (07/21 1557) Pulse Rate:  [96-109] 109 (07/21 1500) Resp:  [18-20] 20 (07/21 1500) BP: (115-127)/(72-78) 115/78 (07/21 1500) SpO2:  [97 %-100 %] 100 % (07/21 1500) Weight:  [81.6 kg] 81.6 kg (07/21 1157)   General:   Pleasant, cooperative in NAD Head:  Normocephalic and atraumatic. Eyes:   No icterus.   Conjunctiva yellow PERRLA. Ears:  Normal auditory acuity. Neck:  Supple; no masses or thyroidomegaly Lungs: Respirations even and unlabored. Lungs clear to  auscultation bilaterally.   No wheezes, crackles, or rhonchi.  Heart:  Regular rate and rhythm;  Without murmur, clicks, rubs or gallops Abdomen:  Soft, nondistended, nontender. Normal bowel sounds. No appreciable masses or hepatomegaly.  No rebound or guarding.  Neurologic:  Alert and oriented x3;  grossly normal neurologically. Skin:  tatoos over arms  Cervical Nodes:  No significant cervical adenopathy. Psych:  Alert and cooperative. Normal affect.  LAB RESULTS: Recent Labs    09/04/22 1205  WBC 7.1  HGB 10.4*  HCT 31.8*  PLT 205   BMET Recent Labs     09/04/22 1205  NA 134*  K 3.2*  CL 107  CO2 16*  GLUCOSE 115*  BUN 13  CREATININE 0.68  CALCIUM 9.1   LFT Recent Labs    09/04/22 1205  PROT 7.9  ALBUMIN 3.1*  AST 238*  ALT 115*  ALKPHOS 140*  BILITOT 31.1*  BILIDIR 19.7*   PT/INR Recent Labs    09/04/22 1441  LABPROT 15.3*  INR 1.2    STUDIES: No results found.    Impression / Plan:   Stephen Mayer is a 27 y.o. y/o male with a of alcohol abuse comes into the hospital with confusion abnormal LFTs.  He states that he has stopped drinking over the past 2 weeks.  Labs indicate a metabolic acidosis, significant hyperbilirubinemia normal INR platelet count of 205.  He did have a hepatitis C viral antibody that was positive.  Differentials include alcoholic hepatitis, infectious hepatitis portal vein thrombosis   Plan 1.  Right upper quadrant ultrasound with Doppler as total bilirubin has risen abnormally 2.  EBV, HSV, CMV, VZV, hepatitis C viral load, alcohol levels, Tylenol level to be checked. 3.  Based on above results if all negative could calculate Madrey discomfort and function tomorrow decide about steroid use. 4.  Correct electrolytes limit use of narcotics, ensure adequate hydration, if constipated suggest lactulose.  The diagnosis of hepatic encephalopathy is a clinical diagnosis and does not necessarily require an elevated ammonia level, 1 can have hepatic encephalopathy with a normal ammonia level and vice versa, to ensure no infection electrolyte imbalance effects of narcotics or benzodiazepines can lead to confusion and a similar picture. 5.  If confusion recurs  would recommend imaging of the head to rule out a subdural hemorrhage which is not uncommon in alcoholics.  Thank you for involving me in the care of this patient.      LOS: 0 days   Wyline Mood, MD  09/04/2022, 5:13 PM

## 2022-09-04 NOTE — ED Triage Notes (Signed)
Pt via POV c/o jaundice with yellowed eyes and skin for several days, recently treated for liver disease. Pt has right-sided abdominal pain rated 5/10 constant. Pt denies n/v/d, no abnormal bleeding. Pt denies ETOH intake.

## 2022-09-04 NOTE — Assessment & Plan Note (Addendum)
See acute hepatitis T bili on admission 31.1 >> improved to nadir on 27.4, now increasing again to 32.0 today (7/24). --Daily CMP to monitor

## 2022-09-05 ENCOUNTER — Inpatient Hospital Stay: Payer: BLUE CROSS/BLUE SHIELD

## 2022-09-05 DIAGNOSIS — B179 Acute viral hepatitis, unspecified: Secondary | ICD-10-CM | POA: Diagnosis present

## 2022-09-05 LAB — CBC
HCT: 28.5 % — ABNORMAL LOW (ref 39.0–52.0)
Hemoglobin: 9.2 g/dL — ABNORMAL LOW (ref 13.0–17.0)
MCH: 24.2 pg — ABNORMAL LOW (ref 26.0–34.0)
MCHC: 32.3 g/dL (ref 30.0–36.0)
MCV: 75 fL — ABNORMAL LOW (ref 80.0–100.0)
Platelets: 177 10*3/uL (ref 150–400)
RBC: 3.8 MIL/uL — ABNORMAL LOW (ref 4.22–5.81)
RDW: 30.3 % — ABNORMAL HIGH (ref 11.5–15.5)
WBC: 4.9 10*3/uL (ref 4.0–10.5)
nRBC: 0 % (ref 0.0–0.2)

## 2022-09-05 LAB — HEPATITIS B CORE ANTIBODY, IGM: Hep B C IgM: NONREACTIVE

## 2022-09-05 LAB — COMPREHENSIVE METABOLIC PANEL
ALT: 91 U/L — ABNORMAL HIGH (ref 0–44)
Albumin: 2.5 g/dL — ABNORMAL LOW (ref 3.5–5.0)
Alkaline Phosphatase: 120 U/L (ref 38–126)
BUN: 11 mg/dL (ref 6–20)
Total Bilirubin: 27.4 mg/dL (ref 0.3–1.2)

## 2022-09-05 LAB — HIV ANTIBODY (ROUTINE TESTING W REFLEX): HIV Screen 4th Generation wRfx: NONREACTIVE

## 2022-09-05 LAB — PHOSPHORUS: Phosphorus: 3 mg/dL (ref 2.5–4.6)

## 2022-09-05 LAB — MAGNESIUM: Magnesium: 1.9 mg/dL (ref 1.7–2.4)

## 2022-09-05 MED ORDER — IBUPROFEN 400 MG PO TABS
400.0000 mg | ORAL_TABLET | Freq: Four times a day (QID) | ORAL | Status: DC | PRN
  Administered 2022-09-05: 400 mg via ORAL
  Filled 2022-09-05: qty 1

## 2022-09-05 MED ORDER — POTASSIUM CHLORIDE 10 MEQ/100ML IV SOLN
10.0000 meq | Freq: Once | INTRAVENOUS | Status: AC
  Administered 2022-09-05: 10 meq via INTRAVENOUS
  Filled 2022-09-05: qty 100

## 2022-09-05 MED ORDER — POTASSIUM CHLORIDE CRYS ER 20 MEQ PO TBCR
40.0000 meq | EXTENDED_RELEASE_TABLET | Freq: Once | ORAL | Status: AC
  Administered 2022-09-05: 40 meq via ORAL
  Filled 2022-09-05: qty 2

## 2022-09-05 MED ORDER — PANTOPRAZOLE SODIUM 40 MG PO TBEC
40.0000 mg | DELAYED_RELEASE_TABLET | Freq: Two times a day (BID) | ORAL | Status: DC
  Administered 2022-09-05 – 2022-09-13 (×16): 40 mg via ORAL
  Filled 2022-09-05 (×16): qty 1

## 2022-09-05 NOTE — Progress Notes (Signed)
Patient received from ultrasound via bed in stable condition. 

## 2022-09-05 NOTE — Progress Notes (Signed)
PHARMACIST - PHYSICIAN COMMUNICATION  CONCERNING: IV to Oral Route Change Policy  RECOMMENDATION: This patient is receiving pantoprazole by the intravenous route.  Based on criteria approved by the Pharmacy and Therapeutics Committee, the intravenous medication(s) is/are being converted to the equivalent oral dose form(s).  DESCRIPTION: These criteria include: The patient is eating (either orally or via tube) and/or has been taking other orally administered medications for a least 24 hours The patient has no evidence of active gastrointestinal bleeding or impaired GI absorption (gastrectomy, short bowel, patient on TNA or NPO).  If you have questions about this conversion, please contact the Pharmacy Department   Tressie Ellis, [facility] 09/05/2022 11:32 AM

## 2022-09-05 NOTE — Plan of Care (Signed)

## 2022-09-05 NOTE — Progress Notes (Signed)
Patient sent for ultrasound via bed in stable condition.

## 2022-09-05 NOTE — Discharge Instructions (Addendum)
Some PCP options in [place] area- not a comprehensive list  [facility]- [phone] [facility]- [phone] [facility]- [phone] [facility]- [phone] [facility]- [phone] Lutricia Horsfall- [phone]  or [facility] Physician Referral Line [phone]    Rent/Utility/Housing  Agency Name: [facility] Address: [address] [place], [place] [address] Phone: [phone] Email: [email or web address] Website: [email or web address] Service(s) Offered: Housing services, self-sufficiency, congregate meal program, weatherization program, Field seismologist program, emergency food assistance,  housing counseling, home ownership program, wheels -towork program.  Agency Name: [facility] Address: [address] [place], [place] [address] Phone: [phone] (8a-4p) [phone] (8p- 10p) Email: [email or web address] Website: [email or web address] Service(s) Offered: A program for homeless and/or needy men that includes one-on-one counseling, life skills training and job rehabilitation.  Agency Name: [facility] [place] Address: [address], [place], [place] [address] Phone: [phone] Website: www.alliedchurches.org Service(s) Offered: Assistance to needy in emergency with utility bills, heating fuel, and prescriptions. Shelter for homeless 7pm-7am. June 09, 2016 15  Agency Name: [facility] [place] (Developmentally Disabled) Address: [address], [place], [place] [address] Phone: [phone] Contact Person: Cathleen Corti Email: [email or web address] Website: [email or web address] Service(s) Offered: Helps individuals with developmental disabilities move from housing that is more restrictive to homes where they  can achieve greater independence and have more  opportunities.  Agency Name: [facility] Address: [address], [place], [place] [address] Phone: [phone] Email: [email or web address] Website: www.[email or web address].org Service(s) Offered: Provides affordable housing for low-income families, elderly, and disabled individuals. Offer a wide range of  programs and services, from financial planning to afterschool and summer programs.  Agency Name: [facility] Address: [address] [place], [place] [address] Phone: [phone] Service(s) Offered: Child support services; child welfare services; food stamps; Medicaid; work first family assistance; and aid with fuel,  rent, food and medicine.  Agency Name: [facility] [place] [employer]. Address: [facility] [address], [place], [place]  [address] Phone: [phone] Website: www.familyabuseservices.org Service(s) Offered: 24 hour Crisis Line: [phone]; 24 hour Emergency Shelter; Transitional Housing; Support Groups; Scientist, physiological; [employer]; Hispanic Outreach: [phone];  Visitation Center: [phone].  Agency Name: [facility] [place]. Address: [address], [place], [place] [address] Phone: [phone] Service(s) Offered: CAP Services; Home and [employer]; Individual or Group Supports; Respite Care Non-Institutional Nursing;  Residential Supports; Respite Care and Personal Care Services; Transportation; Family and Friends Night; Recreational Activities; Three Nutritious Meals/Snacks; Consultation with Registered Dietician; Twenty-four hour Registered Nurse Access; Daily and [employer]; Camp Green Leaves; Dawn for the [employer] (During Summer Months) Bingo Night (Every  Wednesday Night); Special Populations Dance Night  (Every Tuesday Night); Professional Hair Care Services.  Agency Name: [facility] Address: [address], [place], [place] [address] Phone: [phone] Contact Person: Jabier Mutton Website:  [email or web address].Physicist, medical) Offered: Residential treatment facility for women; food and  clothing, educational & employment development and  transportation to work; Counsellor of financial skills;  parenting and family reunification; emotional and spiritual  support; transitional housing for program graduates.  Agency Name: [facility] Address: [address] [place], [place] [address] Phone: [phone] Email: [email or web address] Website: TaskTown.es Service(s) Offered: Public housing units for elderly, disabled, and low income people; housing choice vouchers for income eligible  applicants; shelter plus care vouchers; and Psychologist, clinical.  Agency Name: [facility] [employer] Address: [address], [place], [place] [address] Phone: [phone] Email: habitat1@netzero .net  Website: www.habitatalamance.org Service(s) Offered: Build houses for families in need of decent housing. Each adult in the family must invest 200 hours of labor on  someone else's house, work with volunteers to build their own house, attend classes on budgeting, home maintenance, yard care, and attend homeowner association meetings.  Agency Name: [facility] Address: [address] [place], [place] [address] Phone: [phone] Website: www.rsli.org Service(s) Offered: Intermediate care facilities for intellectually delayed, Supervised Living in group homes for adults with developmental disabilities, Supervised Living for people who have dual diagnoses (MRMI), Independent Living, Supported Living, respite and a variety of CAP services, pre-vocational services, day supports, and [employer].  Agency Name: [place] [employer] Phone: [phone] Website: www.NCForeclosurePrevention.gov Service(s) Offered: Zero-interest, deferred loans to homeowners struggling to pay their mortgage. Call  for more information.    Shelters Resource List  [employer] RESCUE MISSION PROVIDED BY: [facility] [address] [place], [place], [place] Offers a faith-based shelter for homeless men, usually with substance use disorders. Residents receive counseling, life skills training, and help finding a job.  HOMELESS SHELTER PROVIDED BY: [facility] [address] [place], [place], [place] Offers a shelter for men, women, and families experiencing homelessness. Food, clothing and other items are available for residents. Also offers support and services to help residents become self-sufficient. Offers temporary emergency housing for 30 days. Additional shelter may be available when temperatures drop below freezing but is not guaranteed.  FAMILY ABUSE SERVICES OF [facility] COUNTY PROVIDED BY: FAMILY ABUSE SERVICES OF [facility] [address], [place], [place] Offers services for victims of domestic violence. Offers a 24-hour crisis line and emergency shelter. Offers information and referrals to other community resources. Also offers court advocacy and support groups.  HOUSING CHOICE VOUCHER PROGRAM PROVIDED BY: [facility] [place] [address], [place], [place] Offers vouchers for approved Section 8 properties. Vouchers offer financial help with rent    [employer] PROVIDED BY: [facility], [place], [place] Offers emergency shelter for victims of domestic violence. Also offers a 24-hour crisis hotline for victims of domestic violence, safety planning, information and referrals, case management, and support groups for victims of domestic violence.   ACT TOGETHER EMERGENCY SHELTER PROVIDED BY: [facility] [address], [place], [place] Offers a 21-day emergency shelter for youth experiencing a family crisis, abuse, or homelessness. Case management, supportive services, healthcare services, and more are  available for residents. SHELTER PROVIDED BY: [facility] [address], [place], [place] Offers a homeless shelter for people and families. Meals, showers, community referrals, case management, and more are available for residents. HEARTH TRANSITIONAL LIVING PROGRAM PROVIDED BY: [facility] [address], [place], [place] Offers an 69-month homeless shelter for younger adults experiencing homelessness. Case management, independent living skills education, and more are available for residents. PARTNERSHIP VILLAGE PROVIDED BY: [facility] [address], [place],  Offers transitional housing for families and single people experiencing homelessness. Residents meet regularly with a case manager to work towards self-sufficiency TRANSITIONAL HOUSING PROVIDED BY: [facility] [address], [place], [place] Offers transitional housing for male veterans with disabilities. Residents receive meals, transportation, and clothing. Also offers support groups, nutrition classes, and peer support to residents.   WEAVER HOUSE PROVIDED BY: [facility] [address], [place], [place] Offers shelter to adult men and women. Guests receive hot meals and case management. Also offers overnight shelter when temperatures drop during cold winter months  EMERGENCY  FAMILY SHELTER PROVIDED BY: [facility] [address], [place], [place] Offers shelter and support services for families experiencing homelessness.

## 2022-09-05 NOTE — Progress Notes (Signed)
Progress Note    Stephen Mayer  WUJ:[id number] DOB: 16-Apr-1975  DOA: 09/04/2022 PCP: Pcp, No      Brief Narrative:    Medical records reviewed and are as summarized below:  Stephen Mayer is a 47 y.o. male with medical history significant for alcohol use disorder, GERD, osteonecrosis, pancreatic insufficiency, alcoholic liver cirrhosis, recent hospitalization on 08/24/2022 for hematemesis.  Dr. Kathi Ludwig, EGD showed portal hypertensive gastropathy, gastritis, small hiatal hernia and grade 1 esophageal varices.  Unfortunately, patient left AGAINST MEDICAL ADVICE on 08/27/2022. He presented to the hospital again on 09/04/2022 because of right upper quadrant abdominal pain, confusion, jaundice and yellow discoloration of the eyes.  He was found to have hyperbilirubinemia with bilirubin of 31.1.     Assessment/Plan:   Principal Problem:   Hyperbilirubinemia Active Problems:   Elevated LFTs   Hypokalemia   GI bleeding   Alcoholic cirrhosis of liver without ascites (HCC)   ETOH abuse    Body mass index is 28.19 kg/m.   Hyperbilirubinemia, elevated liver enzymes: VZV, CMV, EBV, HBV pending.  Liver ultrasound test is also pending.   Liver cirrhosis with hepatic encephalopathy: Continue lactulose.   Hypokalemia: Replete potassium and monitor levels   Recent GI bleed/hematemesis: EGD on 08/27/2022 showed  portal hypertensive gastropathy, gastritis, small hiatal hernia and grade 1 esophageal varices.   Alcohol use disorder: He said his last alcoholic drink was about 2 weeks prior to admission.  Diet Order             Diet Heart Room service appropriate? Yes; Fluid consistency: Thin  Diet effective now                            Consultants: Gastroenterologist  Procedures: None    Medications:    enoxaparin (LOVENOX) injection  40 mg Subcutaneous Q24H   folic acid  1 mg Oral Daily   multivitamin with minerals  1 tablet Oral Daily    pantoprazole (PROTONIX) IV  40 mg Intravenous Q12H   potassium chloride  40 mEq Oral Once   thiamine  100 mg Oral Daily   Or   thiamine  100 mg Intravenous Daily   Continuous Infusions:     Anti-infectives (From admission, onward)    None              Family Communication/Anticipated D/C date and plan/Code Status   DVT prophylaxis: enoxaparin (LOVENOX) injection 40 mg Start: 09/04/22 2000 SCDs Start: 09/04/22 1457     Code Status: Full Code  Family Communication: None Disposition Plan: Plan to discharge home   Status is: Inpatient Remains inpatient appropriate because: Hyperbilirubinemia       Subjective:   Interval events noted.  He complains of right upper abdominal pain.  No vomiting, hematemesis, bloody stools.  Confusion has improved.  Objective:    Vitals:   09/05/22 0028 09/05/22 0046 09/05/22 0537 09/05/22 0742  BP: 120/62 122/68 115/74 116/73  Pulse: 66 85 77 74  Resp: 16 17 20 16   Temp: 98.9 F (37.2 C) 98.1 F (36.7 C) 98.6 F (37 C)   TempSrc: Oral Oral Oral   SpO2: 97% 98% 97% 97%  Weight:      Height:       No data found.   Intake/Output Summary (Last 24 hours) at 09/05/2022 1111 Last data filed at 09/05/2022 9562 Gross per 24 hour  Intake 1240.81 ml  Output --  Net 1240.81 ml   Filed Weights   09/04/22 1157  Weight: 81.6 kg    Exam:  GEN: NAD SKIN: Jaundice, no rash. EYES: EOMIA, icteric ENT: MMM CV: RRR PULM: CTA B ABD: soft, ND, RUQ tenderness, +BS CNS: AAO x 3, non focal EXT: No edema or tenderness       Data Reviewed:   I have personally reviewed following labs and imaging studies:  Labs: Labs show the following:   Basic Metabolic Panel: Recent Labs  Lab 09/04/22 1205 09/04/22 2125 09/05/22 0627  NA 134*  --  135  K 3.2*  --  3.2*  CL 107  --  111  CO2 16*  --  16*  GLUCOSE 115*  --  105*  BUN 13  --  11  CREATININE 0.68  --  0.54*  CALCIUM 9.1  --  8.6*  MG  --  2.0 1.9  PHOS  --    --  3.0   GFR Estimated Creatinine Clearance: 114.2 mL/min (A) (by C-G formula based on SCr of 0.54 mg/dL (L)). Liver Function Tests: Recent Labs  Lab 09/04/22 1205 09/05/22 0627  AST 238* 192*  ALT 115* 91*  ALKPHOS 140* 120  BILITOT 31.1* 27.4*  PROT 7.9 6.6  ALBUMIN 3.1* 2.5*   No results for input(s): "LIPASE", "AMYLASE" in the last 168 hours. Recent Labs  Lab 09/04/22 1438  AMMONIA 44*   Coagulation profile Recent Labs  Lab 09/04/22 1441  INR 1.2    CBC: Recent Labs  Lab 09/04/22 1205 09/05/22 0627  WBC 7.1 4.9  HGB 10.4* 9.2*  HCT 31.8* 28.5*  MCV 74.3* 75.0*  PLT 205 177   Cardiac Enzymes: No results for input(s): "CKTOTAL", "CKMB", "CKMBINDEX", "TROPONINI" in the last 168 hours. BNP (last 3 results) No results for input(s): "PROBNP" in the last 8760 hours. CBG: No results for input(s): "GLUCAP" in the last 168 hours. D-Dimer: No results for input(s): "DDIMER" in the last 72 hours. Hgb A1c: No results for input(s): "HGBA1C" in the last 72 hours. Lipid Profile: No results for input(s): "CHOL", "HDL", "LDLCALC", "TRIG", "CHOLHDL", "LDLDIRECT" in the last 72 hours. Thyroid function studies: No results for input(s): "TSH", "T4TOTAL", "T3FREE", "THYROIDAB" in the last 72 hours.  Invalid input(s): "FREET3" Anemia work up: No results for input(s): "VITAMINB12", "FOLATE", "FERRITIN", "TIBC", "IRON", "RETICCTPCT" in the last 72 hours. Sepsis Labs: Recent Labs  Lab 09/04/22 1205 09/05/22 0627  WBC 7.1 4.9    Microbiology No results found for this or any previous visit (from the past 240 hour(s)).  Procedures and diagnostic studies:  No results found.             LOS: 1 day   Stephen Mayer  [facility]   Pager on [email or web address]. If 7PM-7AM, please contact night-coverage at [email or web address]     09/05/2022, 11:11 AM

## 2022-09-05 NOTE — Progress Notes (Signed)
[name], MD [address]  [address]  [place], [place] [address]  Main: [phone]  Fax: [phone] Pager: [phone]   Subjective: No acute events overnight.  Patient is concerned about jaundice.   Objective: Vital signs in last 24 hours: Vitals:   09/05/22 0028 09/05/22 0046 09/05/22 0537 09/05/22 0742  BP: 120/62 122/68 115/74 116/73  Pulse: 66 85 77 74  Resp: 16 17 20 16   Temp: 98.9 F (37.2 C) 98.1 F (36.7 C) 98.6 F (37 C)   TempSrc: Oral Oral Oral   SpO2: 97% 98% 97% 97%  Weight:      Height:       Weight change:   Intake/Output Summary (Last 24 hours) at 09/05/2022 0811 Last data filed at 09/05/2022 3244 Gross per 24 hour  Intake 1240.81 ml  Output --  Net 1240.81 ml     Exam: Heart:: Regular rate and rhythm, S1S2 present, or without murmur or extra heart sounds Lungs: normal and clear to auscultation Abdomen: soft, nontender, normal bowel sounds   Lab Results:    Latest Ref Rng & Units 09/05/2022    6:27 AM 09/04/2022   12:05 PM 08/27/2022    8:09 AM  CBC  WBC 4.0 - 10.5 K/uL 4.9  7.1  4.2   Hemoglobin 13.0 - 17.0 g/dL 9.2  01.0  8.2   Hematocrit 39.0 - 52.0 % 28.5  31.8  26.6   Platelets 150 - 400 K/uL 177  205  55       Latest Ref Rng & Units 09/05/2022    6:27 AM 09/04/2022   12:05 PM 08/24/2022    1:48 PM  CMP  Glucose 70 - 99 mg/dL 272  536  644   BUN 6 - 20 mg/dL 11  13  10    Creatinine 0.61 - 1.24 mg/dL 0.34  7.42  5.95   Sodium 135 - 145 mmol/L 135  134  141   Potassium 3.5 - 5.1 mmol/L 3.2  3.2  3.4   Chloride 98 - 111 mmol/L 111  107  110   CO2 22 - 32 mmol/L 16  16  18    Calcium 8.9 - 10.3 mg/dL 8.6  9.1  8.1   Total Protein 6.5 - 8.1 g/dL 6.6  7.9  7.4   Total Bilirubin 0.3 - 1.2 mg/dL 63.8  75.6  8.5   Alkaline Phos 38 - 126 U/L 120  140  168   AST 15 - 41 U/L 192  238  226   ALT 0 - 44 U/L 91  115  82     Micro Results: No results found for this or any previous visit (from the past 240  hour(s)). Studies/Results: No results found. Medications: I have reviewed the patient's current medications. Prior to Admission:  Medications Prior to Admission  Medication Sig Dispense Refill Last Dose   diphenhydrAMINE (BENADRYL) 25 mg capsule Take 25 mg by mouth every 6 (six) hours as needed for itching.   09/03/2022 at AM   Omeprazole Magnesium (PRILOSEC OTC PO) Take 20 mg by mouth daily.   09/04/2022 at AM   furosemide (LASIX) 20 MG tablet Take 1 tablet (20 mg total) by mouth daily for 5 days. (Patient not taking: Reported on 09/04/2022) 5 tablet 0 Not Taking   Scheduled:  enoxaparin (LOVENOX) injection  40 mg Subcutaneous Q24H   folic acid  1 mg Oral Daily   multivitamin with minerals  1 tablet Oral Daily  pantoprazole  40 mg Oral BID   thiamine  100 mg Oral Daily   Or   thiamine  100 mg Intravenous Daily   Continuous: XBM:[id number], LORazepam, ondansetron **OR** ondansetron (ZOFRAN) IV Anti-infectives (From admission, onward)    None      Scheduled Meds:  enoxaparin (LOVENOX) injection  40 mg Subcutaneous Q24H   folic acid  1 mg Oral Daily   multivitamin with minerals  1 tablet Oral Daily   pantoprazole (PROTONIX) IV  40 mg Intravenous Q12H   thiamine  100 mg Oral Daily   Or   thiamine  100 mg Intravenous Daily   Continuous Infusions:  sodium chloride 75 mL/hr at 09/05/22 0633   PRN Meds:.LORazepam, ondansetron **OR** ondansetron (ZOFRAN) IV   Assessment: Principal Problem:   ETOH abuse Active Problems:   GI bleeding   Elevated LFTs   Alcohol abuse   Hyperbilirubinemia  47 year old male with history of alcohol abuse is admitted with encephalopathy and acute hepatitis with no evidence of acute liver failure  Plan: Acute hepatitis with background history of alcoholic liver disease LFTs are downtrending including total bilirubin Ultrasound Doppler did not reveal portal vein thrombosis Alcohol levels undetectable during this admission Hepatitis B core  antibody negative, surface antigen negative Hepatitis B E antigen and antibody in process HCV antibody reactive, HCV RNA in process Hepatitis B DNA in process HSV, VZV, CMV, EBV in process Will determine about initiation of prednisone if viral workup is negative Discussed with patient regarding adequate nutrition and continue to remain abstinent from alcohol use If patient has chronic hep C, it needs to be treated Avoid hepatotoxic agents Adequate hydration LFTs closely GI will follow along with you     LOS: 1 day   Tunya Held 09/05/2022, 8:11 AM

## 2022-09-06 DIAGNOSIS — B179 Acute viral hepatitis, unspecified: Secondary | ICD-10-CM

## 2022-09-06 LAB — COMPREHENSIVE METABOLIC PANEL
ALT: 88 U/L — ABNORMAL HIGH (ref 0–44)
AST: 192 U/L — ABNORMAL HIGH (ref 15–41)
AST: 191 U/L — ABNORMAL HIGH (ref 15–41)
Albumin: 2.5 g/dL — ABNORMAL LOW (ref 3.5–5.0)
Alkaline Phosphatase: 115 U/L (ref 38–126)
Anion gap: 8 (ref 5–15)
Anion gap: 8 (ref 5–15)
BUN: 10 mg/dL (ref 6–20)
CO2: 16 mmol/L — ABNORMAL LOW (ref 22–32)
CO2: 17 mmol/L — ABNORMAL LOW (ref 22–32)
Calcium: 8.6 mg/dL — ABNORMAL LOW (ref 8.9–10.3)
Calcium: 8.8 mg/dL — ABNORMAL LOW (ref 8.9–10.3)
Chloride: 111 mmol/L (ref 98–111)
Chloride: 110 mmol/L (ref 98–111)
Creatinine, Ser: 0.54 mg/dL — ABNORMAL LOW (ref 0.61–1.24)
Creatinine, Ser: 0.51 mg/dL — ABNORMAL LOW (ref 0.61–1.24)
GFR, Estimated: >60 mL/min (ref >60)
GFR, Estimated: >60 mL/min (ref >60)
Glucose, Bld: 105 mg/dL — ABNORMAL HIGH (ref 70–99)
Glucose, Bld: 86 mg/dL (ref 70–99)
Potassium: 3.2 mmol/L — ABNORMAL LOW (ref 3.5–5.1)
Potassium: 3.6 mmol/L (ref 3.5–5.1)
Sodium: 135 mmol/L (ref 135–145)
Sodium: 135 mmol/L (ref 135–145)
Total Bilirubin: 29.5 mg/dL (ref 0.3–1.2)
Total Protein: 6.6 g/dL (ref 6.5–8.1)
Total Protein: 6.5 g/dL (ref 6.5–8.1)

## 2022-09-06 LAB — HEPATITIS B E ANTIBODY: Hep B E Ab: NONREACTIVE

## 2022-09-06 LAB — HEPATITIS B DNA, ULTRAQUANTITATIVE, PCR
HBV DNA SERPL PCR-ACNC: NOT DETECTED IU/mL
HBV DNA SERPL PCR-LOG IU: UNDETERMINED log10 IU/mL

## 2022-09-06 LAB — HEPATITIS B E ANTIGEN: Hep B E Ag: NEGATIVE

## 2022-09-06 LAB — HCV RNA QUANT: HCV Quantitative: NOT DETECTED IU/mL (ref >50)

## 2022-09-06 NOTE — Plan of Care (Signed)

## 2022-09-06 NOTE — Progress Notes (Signed)
Progress Note    Stephen Mayer  AVW:[id number] DOB: 04-05-1975  DOA: 09/04/2022 PCP: Pcp, No      Brief Narrative:    Medical records reviewed and are as summarized below:  Stephen Mayer is a 47 y.o. male with medical history significant for alcohol use disorder, GERD, osteonecrosis, pancreatic insufficiency, alcoholic liver cirrhosis, recent hospitalization on 08/24/2022 for hematemesis.  Dr. Kathi Ludwig, EGD showed portal hypertensive gastropathy, gastritis, small hiatal hernia and grade 1 esophageal varices.  Unfortunately, patient left AGAINST MEDICAL ADVICE on 08/27/2022. He presented to the hospital again on 09/04/2022 because of right upper quadrant abdominal pain, confusion, jaundice and yellow discoloration of the eyes.  He was found to have hyperbilirubinemia with bilirubin of 31.1.     Assessment/Plan:   Principal Problem:   Hyperbilirubinemia Active Problems:   Elevated LFTs   Hypokalemia   GI bleeding   Alcoholic cirrhosis of liver without ascites (HCC)   ETOH abuse   Acute hepatitis    Body mass index is 28.19 kg/m.   Acute hepatitis, hyperbilirubinemia, elevated liver enzymes: Rule out acute viral hepatitis.  VZV, CMV, EBV, HBV pending.  HCV antibody reactive.  HCVRNA is pending. No portal vein thrombosis on liver ultrasound.   Liver cirrhosis with ? hepatic encephalopathy: Improved   Hypokalemia: Improved   Recent GI bleed/hematemesis: EGD on 08/27/2022 showed  portal hypertensive gastropathy, gastritis, small hiatal hernia and grade 1 esophageal varices.   Alcohol use disorder: He said his last alcoholic drink was about 2 weeks prior to admission.  Diet Order             Diet Heart Room service appropriate? Yes; Fluid consistency: Thin  Diet effective now                            Consultants: Gastroenterologist  Procedures: None    Medications:    enoxaparin (LOVENOX) injection  40 mg Subcutaneous Q24H   folic  acid  1 mg Oral Daily   multivitamin with minerals  1 tablet Oral Daily   pantoprazole  40 mg Oral BID   thiamine  100 mg Oral Daily   Or   thiamine  100 mg Intravenous Daily   Continuous Infusions:     Anti-infectives (From admission, onward)    None              Family Communication/Anticipated D/C date and plan/Code Status   DVT prophylaxis: enoxaparin (LOVENOX) injection 40 mg Start: 09/04/22 2000 SCDs Start: 09/04/22 1457     Code Status: Full Code  Family Communication: None Disposition Plan: Plan to discharge home   Status is: Inpatient Remains inpatient appropriate because: Hyperbilirubinemia       Subjective:   Interval events noted.  Right-sided abdominal pain has improved.  Objective:    Vitals:   09/05/22 1200 09/05/22 1641 09/05/22 2018 09/06/22 0824  BP:  111/61 115/61 119/74  Pulse: 80 80 74 74  Resp:  16 18 18   Temp:  98.1 F (36.7 C) 97.8 F (36.6 C) 98.6 F (37 C)  TempSrc:  Oral Oral   SpO2:  97% 92% 99%  Weight:      Height:       No data found.   Intake/Output Summary (Last 24 hours) at 09/06/2022 1324 Last data filed at 09/06/2022 1059 Gross per 24 hour  Intake 480 ml  Output --  Net 480 ml   [employer]  09/04/22 1157  Weight: 81.6 kg    Exam:   GEN: NAD SKIN: Jaundice EYES: Icteric, no pallor ENT: MMM CV: RRR PULM: CTA B ABD: soft, ND, NT, +BS CNS: AAO x 3, non focal EXT: No edema or tenderness        Data Reviewed:   I have personally reviewed following labs and imaging studies:  Labs: Labs show the following:   Basic Metabolic Panel: Recent Labs  Lab 09/04/22 1205 09/04/22 2125 09/05/22 0627 09/06/22 0435  NA 134*  --  135 135  K 3.2*  --  3.2* 3.6  CL 107  --  111 110  CO2 16*  --  16* 17*  GLUCOSE 115*  --  105* 86  BUN 13  --  11 10  CREATININE 0.68  --  0.54* 0.51*  CALCIUM 9.1  --  8.6* 8.8*  MG  --  2.0 1.9  --   PHOS  --   --  3.0  --    GFR Estimated  Creatinine Clearance: 114.2 mL/min (A) (by C-G formula based on SCr of 0.51 mg/dL (L)). Liver Function Tests: Recent Labs  Lab 09/04/22 1205 09/05/22 0627 09/06/22 0435  AST 238* 192* 191*  ALT 115* 91* 88*  ALKPHOS 140* 120 115  BILITOT 31.1* 27.4* 29.5*  PROT 7.9 6.6 6.5  ALBUMIN 3.1* 2.5* 2.5*   No results for input(s): "LIPASE", "AMYLASE" in the last 168 hours. Recent Labs  Lab 09/04/22 1438  AMMONIA 44*   Coagulation profile Recent Labs  Lab 09/04/22 1441  INR 1.2    CBC: Recent Labs  Lab 09/04/22 1205 09/05/22 0627  WBC 7.1 4.9  HGB 10.4* 9.2*  HCT 31.8* 28.5*  MCV 74.3* 75.0*  PLT 205 177   Cardiac Enzymes: No results for input(s): "CKTOTAL", "CKMB", "CKMBINDEX", "TROPONINI" in the last 168 hours. BNP (last 3 results) No results for input(s): "PROBNP" in the last 8760 hours. CBG: No results for input(s): "GLUCAP" in the last 168 hours. D-Dimer: No results for input(s): "DDIMER" in the last 72 hours. Hgb A1c: No results for input(s): "HGBA1C" in the last 72 hours. Lipid Profile: No results for input(s): "CHOL", "HDL", "LDLCALC", "TRIG", "CHOLHDL", "LDLDIRECT" in the last 72 hours. Thyroid function studies: No results for input(s): "TSH", "T4TOTAL", "T3FREE", "THYROIDAB" in the last 72 hours.  Invalid input(s): "FREET3" Anemia work up: No results for input(s): "VITAMINB12", "FOLATE", "FERRITIN", "TIBC", "IRON", "RETICCTPCT" in the last 72 hours. Sepsis Labs: Recent Labs  Lab 09/04/22 1205 09/05/22 0627  WBC 7.1 4.9    Microbiology No results found for this or any previous visit (from the past 240 hour(s)).  Procedures and diagnostic studies:  US ABDOMEN LIMITED WITH LIVER DOPPLER  Result Date: 09/05/2022 CLINICAL DATA:  Abnormal liver function tests EXAM: ULTRASOUND ABDOMEN LIMITED RIGHT UPPER QUADRANT with liver Doppler COMPARISON:  Standard ultrasound 08/25/2022.  CT 08/24/2018 FINDINGS: Liver: Diffusely echogenic hepatic parenchyma  consistent with fatty liver infiltration. Portal vein is patent but diffusely has a reversed flow away from the liver. Diameter of the main portal vein of 1.1 cm. Portal vein velocities proximally 44.8 centimeters/second, mid 42.0 centimeters/second and distal 32.6 centimeters/second. Right portal vein velocity 41.0 centimeters/second and left 25.7 centimeters/second. Appropriate flow seen of the hepatic artery, splenic vein. Velocity of the hepatic artery of 142.8 centimeters/second, slightly elevated. Patent hepatic veins. Spleen measures 13.3 x 13.3 x 6.5 cm with a volume of 600 cc. No ascites identified. IMPRESSION: Reverse flow in the portal venous system.  No thrombus. Mild splenic enlargement.  Fatty liver infiltration. Electronically Signed   By: Karen Kays M.D.   On: 09/05/2022 12:09               LOS: 2 days   Saajan Willmon  [facility]   Pager on [email or web address]. If 7PM-7AM, please contact night-coverage at [email or web address]     09/06/2022, 1:24 PM

## 2022-09-07 DIAGNOSIS — K7010 Alcoholic hepatitis without ascites: Secondary | ICD-10-CM

## 2022-09-07 DIAGNOSIS — G9341 Metabolic encephalopathy: Secondary | ICD-10-CM | POA: Diagnosis present

## 2022-09-07 LAB — COMPREHENSIVE METABOLIC PANEL
ALT: 87 U/L — ABNORMAL HIGH (ref 0–44)
AST: 209 U/L — ABNORMAL HIGH (ref 15–41)
Albumin: 2.5 g/dL — ABNORMAL LOW (ref 3.5–5.0)
Alkaline Phosphatase: 111 U/L (ref 38–126)
Anion gap: 8 (ref 5–15)
BUN: 11 mg/dL (ref 6–20)
CO2: 18 mmol/L — ABNORMAL LOW (ref 22–32)
Calcium: 8.8 mg/dL — ABNORMAL LOW (ref 8.9–10.3)
Chloride: 111 mmol/L (ref 98–111)
Creatinine, Ser: 0.57 mg/dL — ABNORMAL LOW (ref 0.61–1.24)
GFR, Estimated: >60 mL/min (ref >60)
Glucose, Bld: 100 mg/dL — ABNORMAL HIGH (ref 70–99)
Potassium: 3.2 mmol/L — ABNORMAL LOW (ref 3.5–5.1)
Sodium: 137 mmol/L (ref 135–145)
Total Bilirubin: 32 mg/dL (ref 0.3–1.2)
Total Protein: 6.7 g/dL (ref 6.5–8.1)

## 2022-09-07 LAB — HSV DNA BY PCR (REFERENCE LAB)
HSV 1 DNA: NEGATIVE
HSV 2 DNA: NEGATIVE

## 2022-09-07 LAB — EPSTEIN BARR VRS(EBV DNA BY PCR): EBV DNA QN by PCR: NEGATIVE [IU]/mL

## 2022-09-07 LAB — VARICELLA-ZOSTER BY PCR: Varicella-Zoster, PCR: NEGATIVE

## 2022-09-07 LAB — CMV DNA BY PCR, QUALITATIVE: CMV DNA, Qual PCR: NEGATIVE

## 2022-09-07 MED ORDER — POTASSIUM CHLORIDE CRYS ER 20 MEQ PO TBCR
40.0000 meq | EXTENDED_RELEASE_TABLET | ORAL | Status: AC
  Administered 2022-09-07 (×2): 40 meq via ORAL
  Filled 2022-09-07 (×2): qty 2

## 2022-09-07 MED ORDER — PREDNISONE 20 MG PO TABS
40.0000 mg | ORAL_TABLET | Freq: Every day | ORAL | Status: DC
  Administered 2022-09-07 – 2022-09-13 (×7): 40 mg via ORAL
  Filled 2022-09-07 (×7): qty 2

## 2022-09-07 MED ORDER — DIPHENHYDRAMINE HCL 25 MG PO CAPS: 25.0000 mg | ORAL_CAPSULE | Freq: Four times a day (QID) | ORAL | Status: DC | PRN

## 2022-09-07 MED ORDER — DIPHENHYDRAMINE HCL 25 MG PO CAPS
25.0000 mg | ORAL_CAPSULE | Freq: Four times a day (QID) | ORAL | Status: DC | PRN
  Administered 2022-09-07: 25 mg via ORAL
  Filled 2022-09-07: qty 1

## 2022-09-07 MED ORDER — OXYCODONE HCL 5 MG PO TABS
5.0000 mg | ORAL_TABLET | Freq: Once | ORAL | Status: AC
  Administered 2022-09-07: 5 mg via ORAL
  Filled 2022-09-07: qty 1

## 2022-09-07 NOTE — Assessment & Plan Note (Signed)
Doppler U/S negative for portal vein thrombosis.   Alcohol undetectable this admission. Hepatitis B core Ab negative, surface Ag negative Follow pending viral labs - Hep B E Ag and Ab, HCV RNA, Hep B DNA,   HSV, VZV, CMV, EBV  --GI is following - see their recs --They will decide about steroids once viral studies return if all negative. --Optimize nutrition, protein intake  --Absolutely abstain from alcohol  If patient has chronic hep C, it needs to be treated --Avoid hepatotoxic agents --Monitor LFTs

## 2022-09-07 NOTE — Assessment & Plan Note (Signed)
Pt reported last alcoholic drink was about 2 weeks prior to admission.

## 2022-09-07 NOTE — Assessment & Plan Note (Signed)
RESOLVED. Likely due to acute hepatitis, mild Hepatic encephalopathy (ammonia 44 on admission). --Mgmt of underlying issues as outlined --Delirium precautions

## 2022-09-07 NOTE — Assessment & Plan Note (Signed)
See acute hepatitis. Follow GI recommendations.

## 2022-09-07 NOTE — Progress Notes (Signed)
Progress Note   Patient: Stephen Mayer QMV:[id number] DOB: [birth year] DOA: 09/04/2022     3 DOS: the patient was seen and examined on 09/07/2022   Brief hospital course: Stephen Mayer is a 47 y.o. male with medical history significant for alcohol use disorder, GERD, osteonecrosis, pancreatic insufficiency, alcoholic liver cirrhosis, recent hospitalization on 08/24/2022 for hematemesis.  Dr. Kathi Ludwig, EGD showed portal hypertensive gastropathy, gastritis, small hiatal hernia and grade 1 esophageal varices.  Unfortunately, patient left AGAINST MEDICAL ADVICE on 08/27/2022. He presented to the hospital again on 09/04/2022 because of right upper quadrant abdominal pain, confusion, jaundice and yellow discoloration of the eyes.   He was found to have hyperbilirubinemia with bilirubin of 31.1.  Further hospital course and management as outlined below.    Assessment and Plan: * Acute hepatitis Doppler U/S negative for portal vein thrombosis.   Alcohol undetectable this admission. Hepatitis B core Ab negative, surface Ag negative Follow pending viral labs - Hep B E Ag and Ab, HCV RNA, Hep B DNA,   HSV, VZV, CMV, EBV  --GI is following - see their recs --They will decide about steroids once viral studies return if all negative. --Optimize nutrition, protein intake  --Absolutely abstain from alcohol  If patient has chronic hep C, it needs to be treated --Avoid hepatotoxic agents --Monitor LFTs   Hyperbilirubinemia See acute hepatitis  Elevated LFTs See acute hepatitis T bili on admission 31.1 >> improved to nadir on 27.4, now increasing again to 32.0 today (7/24). --Daily CMP to monitor  Hypokalemia K 3.2 this AM (7/24). PO replacement ordered. Monitor and replace K PRN, check Mg level  GI bleeding Hematemesis - Admission 7/10 through 7/13 with patient signed out AMA Patient denies any recurrence of GI bleeding since.  EGD on 08/27/2022 showed portal hypertensive gastropathy,  gastritis, small hiatal hernia and grade 1 esophageal varices.  --Hbg stable.  Monitor.  Acute metabolic encephalopathy RESOLVED. Likely due to acute hepatitis, mild Hepatic encephalopathy (ammonia 44 on admission). --Mgmt of underlying issues as outlined --Delirium precautions  ETOH abuse Pt reported last alcoholic drink was about 2 weeks prior to admission.  Alcoholic cirrhosis of liver without ascites (HCC) See acute hepatitis. Follow GI recommendations.        Subjective: Pt awake resting in bed this AM.  He reports ongoing right-sided abdominal pain.  Denies N/V and tolerating meals okay.  Stools remain soft.  His main concern is jaundiced skin color and itching.  No other acute complaints.    Physical Exam: Vitals:   09/06/22 1512 09/06/22 1948 09/07/22 0426 09/07/22 0759  BP: 108/62 118/65 (!) 127/111 113/68  Pulse: 79 82 80 76  Resp: 20 18 17 18   Temp: 97.9 F (36.6 C) 98.6 F (37 C) 98.2 F (36.8 C) 98.6 F (37 C)  TempSrc: Oral Oral Oral Oral  SpO2: 97% 97% 98% 98%  Weight:      Height:       General exam: awake, alert, no acute distress HEENT: icteric sclera, moist mucus membranes, hearing grossly normal  Respiratory system: CTAB, no wheezes, rales or rhonchi, normal respiratory effort. Cardiovascular system: normal S1/S2, RRR, no pedal edema.   Gastrointestinal system: soft, NT, ND, +bowel sounds Central nervous system: A&O x4. no gross focal neurologic deficits, normal speech Extremities: moves all, no edema, normal tone Skin: dry, intact, jaundiced Psychiatry: normal mood, congruent affect, judgement and insight appear normal '  Data Reviewed:  Notable labs ---   K 3.2 Bicarb 17 >>  18 Glucose 100 Albumin 2.5 AST 209 (from 191) ALT 87 (from 88) T bili 32.0 (from 29.5) Last Hbg 9.2 stable  Family Communication: none  Disposition: Status is: Inpatient Remains inpatient appropriate because: ongoing evaluation and closely monitoring liver  function with acute hepatitis. T bili increasing today.   Planned Discharge Destination: Home    Time spent: 42 minutes  Author: Pennie Banter, DO 09/07/2022 12:37 PM  For on call review [email or web address].

## 2022-09-07 NOTE — Progress Notes (Signed)
Stephen Repress, MD [address]  [address]  [place], [place] [address]  Main: [phone]  Fax: [phone] Pager: [phone]   Subjective: No acute events overnight.  Lying in bed comfortable, tolerating p.o. well   Objective: Vital signs in last 24 hours: Vitals:   09/06/22 1948 09/07/22 0426 09/07/22 0759 09/07/22 1537  BP: 118/65 (!) 127/111 113/68 112/66  Pulse: 82 80 76 77  Resp: 18 17 18 18   Temp: 98.6 F (37 C) 98.2 F (36.8 C) 98.6 F (37 C) 98 F (36.7 C)  TempSrc: Oral Oral Oral   SpO2: 97% 98% 98% 99%  Weight:      Height:       Weight change:   Intake/Output Summary (Last 24 hours) at 09/07/2022 1751 Last data filed at 09/06/2022 1944 Gross per 24 hour  Intake 120 ml  Output --  Net 120 ml     Exam: Heart:: Regular rate and rhythm, S1S2 present, or without murmur or extra heart sounds Lungs: normal and clear to auscultation Abdomen: soft, nontender, normal bowel sounds   Lab Results:    Latest Ref Rng & Units 09/05/2022    6:27 AM 09/04/2022   12:05 PM 08/27/2022    8:09 AM  CBC  WBC 4.0 - 10.5 K/uL 4.9  7.1  4.2   Hemoglobin 13.0 - 17.0 g/dL 9.2  32.4  8.2   Hematocrit 39.0 - 52.0 % 28.5  31.8  26.6   Platelets 150 - 400 K/uL 177  205  55       Latest Ref Rng & Units 09/07/2022    6:49 AM 09/06/2022    4:35 AM 09/05/2022    6:27 AM  CMP  Glucose 70 - 99 mg/dL 401  86  027   BUN 6 - 20 mg/dL 11  10  11    Creatinine 0.61 - 1.24 mg/dL 2.53  6.64  4.03   Sodium 135 - 145 mmol/L 137  135  135   Potassium 3.5 - 5.1 mmol/L 3.2  3.6  3.2   Chloride 98 - 111 mmol/L 111  110  111   CO2 22 - 32 mmol/L 18  17  16    Calcium 8.9 - 10.3 mg/dL 8.8  8.8  8.6   Total Protein 6.5 - 8.1 g/dL 6.7  6.5  6.6   Total Bilirubin 0.3 - 1.2 mg/dL 47.4  25.9  56.3  C  Alkaline Phos 38 - 126 U/L 111  115  120   AST 15 - 41 U/L 209  191  192   ALT 0 - 44 U/L 87  88  91     C Corrected result    Micro Results: Recent Results (from the past 240  hour(s))  Varicella-zoster by PCR     Status: None   Collection Time: 09/04/22  6:44 PM   Specimen: Blood  Result Value Ref Range Status   Varicella-Zoster, PCR Negative Negative Final    Comment: (NOTE) No Varicella Zoster Virus DNA detected. This test was developed and its performance characteristics determined by [facility].  It has not been cleared or approved by the Food and Drug Administration.  The FDA has determined that such clearance or approval is not necessary. Performed At: [facility] [address] [place], [place] [id number] Jolene Schimke MD JJ:[id number]    Studies/Results: No results found. Medications: I have reviewed the patient's current medications. Prior to Admission:  Medications Prior to Admission  Medication Sig  Dispense Refill Last Dose   diphenhydrAMINE (BENADRYL) 25 mg capsule Take 25 mg by mouth every 6 (six) hours as needed for itching.   09/03/2022 at AM   Omeprazole Magnesium (PRILOSEC OTC PO) Take 20 mg by mouth daily.   09/04/2022 at AM   furosemide (LASIX) 20 MG tablet Take 1 tablet (20 mg total) by mouth daily for 5 days. (Patient not taking: Reported on 09/04/2022) 5 tablet 0 Not Taking   Scheduled:  enoxaparin (LOVENOX) injection  40 mg Subcutaneous Q24H   folic acid  1 mg Oral Daily   multivitamin with minerals  1 tablet Oral Daily   pantoprazole  40 mg Oral BID   predniSONE  40 mg Oral Q breakfast   thiamine  100 mg Oral Daily   Or   thiamine  100 mg Intravenous Daily   Continuous: ZOX:[id number], ibuprofen, ondansetron **OR** ondansetron (ZOFRAN) IV Anti-infectives (From admission, onward)    None      Scheduled Meds:  enoxaparin (LOVENOX) injection  40 mg Subcutaneous Q24H   folic acid  1 mg Oral Daily   multivitamin with minerals  1 tablet Oral Daily   pantoprazole  40 mg Oral BID   predniSONE  40 mg Oral Q breakfast   thiamine  100 mg Oral Daily   Or   thiamine  100 mg Intravenous Daily   Continuous  Infusions:   PRN Meds:.diphenhydrAMINE, ibuprofen, ondansetron **OR** ondansetron (ZOFRAN) IV  47 year old male with history of alcohol abuse is admitted with encephalopathy and acute hepatitis with no evidence of acute liver failure    Assessment: Principal Problem:   Acute hepatitis Active Problems:   GI bleeding   Hypokalemia   Elevated LFTs   Alcoholic cirrhosis of liver without ascites (HCC)   ETOH abuse   Hyperbilirubinemia   Acute metabolic encephalopathy  47 year old male with history of alcohol abuse is admitted with encephalopathy and acute hepatitis with no evidence of acute liver failure  Plan: Acute alcoholic hepatitis with background history of alcoholic liver disease LFTs remain elevated including T. bili, Madrey's discriminant function score is 46 Ultrasound Doppler did not reveal portal vein thrombosis Alcohol levels undetectable during this admission Hepatitis B core antibody negative, surface antigen negative Hepatitis B E antigen and antibody negative HCV antibody reactive, HCV RNA undetected Hepatitis B DNA undetected HSV, VZV, CMV negative, EBV in process Recommend to start prednisone 40 mg daily and monitor LFTs daily to assess Lille's score on day 5 in order to determine whether or not to continue prednisone for 4 weeks for treatment of acute alcoholic hepatitis Discussed with patient regarding adequate nutrition and continue to remain abstinent from alcohol use Avoid hepatotoxic agents Adequate hydration, advised patient not to consume any carbonated beverages, sugary drinks, added sugars GI will follow along with you If patient develops pruritus secondary to hyperbilirubinemia, recommend to start hydroxyzine/Atarax at bedtime     LOS: 3 days   Stephen Mayer 09/07/2022, 5:51 PM

## 2022-09-07 NOTE — Assessment & Plan Note (Addendum)
K 3.1 this AM (7/27). PO replacement ordered. Monitor and replace K PRN, check Mg level

## 2022-09-07 NOTE — TOC Progression Note (Signed)
Transition of Care [facility]) - Progression Note    Patient Details  Name: Stephen Mayer MRN: [id number] Date of Birth: 07/24/1975  [facility] CM/SW Contact  Allena Katz, LCSW Phone Number: 09/07/2022, 9:03 AM  Clinical Narrative:   CSW spoke with pt regarding his substance use. Pt not interested in treatment at this time. Pt also listed as uninsured and not having a PCP. Pt reports that he plans on establishing with a PCP once he gets his medicaid switched over. Pt has moved from [place] and has been here a year but has not switched over his medicaid. Pt plans to do this soon and then establish care. Pt currently works and states he has no additional needs at this time.          Expected Discharge Plan and Services                                               Social Determinants of Health (SDOH) Interventions SDOH Screenings   Food Insecurity: No Food Insecurity (09/05/2022)  Housing: Low Risk  (09/05/2022)  Transportation Needs: No Transportation Needs (09/05/2022)  Utilities: Not At Risk (09/05/2022)  Tobacco Use: High Risk (09/04/2022)    Readmission Risk Interventions     No data to display

## 2022-09-08 DIAGNOSIS — K7010 Alcoholic hepatitis without ascites: Secondary | ICD-10-CM

## 2022-09-08 LAB — COMPREHENSIVE METABOLIC PANEL
ALT: 90 U/L — ABNORMAL HIGH (ref 0–44)
AST: 207 U/L — ABNORMAL HIGH (ref 15–41)
Alkaline Phosphatase: 113 U/L (ref 38–126)
Anion gap: 8 (ref 5–15)
BUN: 12 mg/dL (ref 6–20)
CO2: 18 mmol/L — ABNORMAL LOW (ref 22–32)
Calcium: 8.9 mg/dL (ref 8.9–10.3)
Chloride: 109 mmol/L (ref 98–111)
Creatinine, Ser: 0.5 mg/dL — ABNORMAL LOW (ref 0.61–1.24)
Glucose, Bld: 98 mg/dL (ref 70–99)
Potassium: 3.6 mmol/L (ref 3.5–5.1)
Sodium: 135 mmol/L (ref 135–145)
Total Protein: 7.2 g/dL (ref 6.5–8.1)

## 2022-09-08 LAB — MAGNESIUM: Magnesium: 2 mg/dL (ref 1.7–2.4)

## 2022-09-08 LAB — PROTIME-INR
INR: 1.2 (ref 0.8–1.2)
Prothrombin Time: 15 seconds (ref 11.4–15.2)

## 2022-09-08 MED ORDER — OXYCODONE HCL 5 MG PO TABS: 5.0000 mg | ORAL_TABLET | Freq: Four times a day (QID) | ORAL | Status: DC | PRN

## 2022-09-08 MED ORDER — OXYCODONE HCL 5 MG PO TABS
5.0000 mg | ORAL_TABLET | Freq: Three times a day (TID) | ORAL | Status: DC | PRN
  Administered 2022-09-08 – 2022-09-13 (×11): 5 mg via ORAL
  Filled 2022-09-08 (×11): qty 1

## 2022-09-08 MED ORDER — HYDROXYZINE HCL 50 MG PO TABS
25.0000 mg | ORAL_TABLET | Freq: Three times a day (TID) | ORAL | Status: DC | PRN
  Administered 2022-09-08 – 2022-09-12 (×4): 25 mg via ORAL
  Filled 2022-09-08 (×4): qty 1

## 2022-09-08 MED ORDER — HYDROXYZINE HCL 50 MG PO TABS
25.0000 mg | ORAL_TABLET | Freq: Every day | ORAL | Status: DC
  Administered 2022-09-08 – 2022-09-12 (×5): 25 mg via ORAL
  Filled 2022-09-08 (×5): qty 1

## 2022-09-08 NOTE — Progress Notes (Signed)
Stephen Repress, MD [address]  [address]  [place], [place] [address]  Main: [phone]  Fax: [phone] Pager: [phone]   Subjective: No acute events overnight.  Lying in bed comfortable, tolerating p.o. well, reports generalized pruritus, started on Atarax   Objective: Vital signs in last 24 hours: Vitals:   09/07/22 1537 09/07/22 2013 09/08/22 0403 09/08/22 0751  BP: 112/66 125/71 115/69 122/67  Pulse: 77 81 68 72  Resp: 18 18 18 16   Temp: 98 F (36.7 C) 98.2 F (36.8 C) 98 F (36.7 C) 98.1 F (36.7 C)  TempSrc:  Oral Oral Oral  SpO2: 99% 99% 99% 95%  Weight:      Height:       Weight change:   Intake/Output Summary (Last 24 hours) at 09/08/2022 1520 Last data filed at 09/07/2022 1933 Gross per 24 hour  Intake 240 ml  Output --  Net 240 ml     Exam: Heart:: Regular rate and rhythm, S1S2 present, or without murmur or extra heart sounds Lungs: normal and clear to auscultation Abdomen: soft, nontender, normal bowel sounds   Lab Results:    Latest Ref Rng & Units 09/05/2022    6:27 AM 09/04/2022   12:05 PM 08/27/2022    8:09 AM  CBC  WBC 4.0 - 10.5 K/uL 4.9  7.1  4.2   Hemoglobin 13.0 - 17.0 g/dL 9.2  52.8  8.2   Hematocrit 39.0 - 52.0 % 28.5  31.8  26.6   Platelets 150 - 400 K/uL 177  205  55       Latest Ref Rng & Units 09/08/2022    4:01 AM 09/07/2022    6:49 AM 09/06/2022    4:35 AM  CMP  Glucose 70 - 99 mg/dL 98  413  86   BUN 6 - 20 mg/dL 12  11  10    Creatinine 0.61 - 1.24 mg/dL 2.44  0.10  2.72   Sodium 135 - 145 mmol/L 135  137  135   Potassium 3.5 - 5.1 mmol/L 3.6  3.2  3.6   Chloride 98 - 111 mmol/L 109  111  110   CO2 22 - 32 mmol/L 18  18  17    Calcium 8.9 - 10.3 mg/dL 8.9  8.8  8.8   Total Protein 6.5 - 8.1 g/dL 7.2  6.7  6.5   Total Bilirubin 0.3 - 1.2 mg/dL 53.6  64.4  03.4   Alkaline Phos 38 - 126 U/L 113  111  115   AST 15 - 41 U/L 207  209  191   ALT 0 - 44 U/L 90  87  88     Micro Results: Recent Results  (from the past 240 hour(s))  Varicella-zoster by PCR     Status: None   Collection Time: 09/04/22  6:44 PM   Specimen: Blood  Result Value Ref Range Status   Varicella-Zoster, PCR Negative Negative Final    Comment: (NOTE) No Varicella Zoster Virus DNA detected. This test was developed and its performance characteristics determined by [facility].  It has not been cleared or approved by the Food and Drug Administration.  The FDA has determined that such clearance or approval is not necessary. Performed At: [facility] [address] [place], [place] [id number] Jolene Schimke MD VF:[id number]    Studies/Results: No results found. Medications: I have reviewed the patient's current medications. Prior to Admission:  Medications Prior to Admission  Medication Sig Dispense Refill  Last Dose   diphenhydrAMINE (BENADRYL) 25 mg capsule Take 25 mg by mouth every 6 (six) hours as needed for itching.   09/03/2022 at AM   Omeprazole Magnesium (PRILOSEC OTC PO) Take 20 mg by mouth daily.   09/04/2022 at AM   furosemide (LASIX) 20 MG tablet Take 1 tablet (20 mg total) by mouth daily for 5 days. (Patient not taking: Reported on 09/04/2022) 5 tablet 0 Not Taking   Scheduled:  enoxaparin (LOVENOX) injection  40 mg Subcutaneous Q24H   folic acid  1 mg Oral Daily   hydrOXYzine  25 mg Oral QHS   multivitamin with minerals  1 tablet Oral Daily   pantoprazole  40 mg Oral BID   predniSONE  40 mg Oral Q breakfast   thiamine  100 mg Oral Daily   Or   thiamine  100 mg Intravenous Daily   Continuous: MVH:[id number], ibuprofen, ondansetron **OR** ondansetron (ZOFRAN) IV, oxyCODONE Anti-infectives (From admission, onward)    None      Scheduled Meds:  enoxaparin (LOVENOX) injection  40 mg Subcutaneous Q24H   folic acid  1 mg Oral Daily   hydrOXYzine  25 mg Oral QHS   multivitamin with minerals  1 tablet Oral Daily   pantoprazole  40 mg Oral BID   predniSONE  40 mg Oral Q breakfast   thiamine   100 mg Oral Daily   Or   thiamine  100 mg Intravenous Daily   Continuous Infusions:   PRN Meds:.hydrOXYzine, ibuprofen, ondansetron **OR** ondansetron (ZOFRAN) IV, oxyCODONE  47 year old male with history of alcohol abuse is admitted with encephalopathy and acute hepatitis with no evidence of acute liver failure    Assessment: Principal Problem:   Acute hepatitis Active Problems:   GI bleeding   Hypokalemia   Elevated LFTs   Alcoholic cirrhosis of liver without ascites (HCC)   ETOH abuse   Hyperbilirubinemia   Acute metabolic encephalopathy  47 year old male with history of alcohol abuse is admitted with encephalopathy and acute hepatitis with no evidence of acute liver failure  Plan: Acute alcoholic hepatitis with background history of alcoholic liver disease LFTs remain elevated including T. bili, Madrey's discriminant function score is 46 Ultrasound Doppler did not reveal portal vein thrombosis Alcohol levels undetectable during this admission Hepatitis B core antibody negative, surface antigen negative Hepatitis B E antigen and antibody negative HCV antibody reactive, HCV RNA undetected Hepatitis B DNA undetected HSV, VZV, CMV, EBV serologies were negative Initiated prednisone 40 mg daily and monitor LFTs daily to assess Lille's score on day 5 in order to determine whether or not to continue prednisone for 4 weeks for treatment of acute alcoholic hepatitis Discussed with patient regarding adequate nutrition and continue to remain abstinent from alcohol use Avoid hepatotoxic agents Adequate hydration, advised patient not to consume any carbonated beverages, sugary drinks, added sugars GI will follow along with you  Generalized pruritus secondary to hyperbilirubinemia Continue Atarax 25 to 50 mg 3 times daily as needed     LOS: 4 days   Stephen Mayer 09/08/2022, 3:20 PM

## 2022-09-08 NOTE — Progress Notes (Signed)
Progress Note   Patient: Stephen Mayer ION:[id number] DOB: 1975-09-13 DOA: 09/04/2022     4 DOS: the patient was seen and examined on 09/08/2022   Brief hospital course: Stephen Mayer is a 47 y.o. male with medical history significant for alcohol use disorder, GERD, osteonecrosis, pancreatic insufficiency, alcoholic liver cirrhosis, recent hospitalization on 08/24/2022 for hematemesis.  Dr. [name], EGD showed portal hypertensive gastropathy, gastritis, small hiatal hernia and grade 1 esophageal varices.  Unfortunately, patient left AGAINST MEDICAL ADVICE on 08/27/2022. He presented to the hospital again on 09/04/2022 because of right upper quadrant abdominal pain, confusion, jaundice and yellow discoloration of the eyes.   He was found to have hyperbilirubinemia with bilirubin of 31.1.  Further hospital course and management as outlined below.    Assessment and Plan: * Acute hepatitis Acute alcoholic hepatitis Doppler U/S negative for portal vein thrombosis.   Alcohol undetectable this admission. Full viral work up negative including - Hep B core Ab , surface Ag negative, Hep B E Ag and Ab, HCV RNA, Hep B DNA, HSV, VZV, CMV, EBV  --GI is following - see their recs --Prednisone 40 mg daily started 7/24 --Optimize nutrition, protein intake  --Absolutely abstain from alcohol  If patient has chronic hep C, it needs to be treated --Avoid hepatotoxic agents --Monitor LFTs daily  Hyperbilirubinemia See acute hepatitis  Elevated LFTs See acute hepatitis T bili on admission 31.1 >> improved to nadir on 27.4, now increasing again to 32.0 today (7/24). --Daily CMP to monitor  Hypokalemia K 3.2 this AM (7/24). PO replacement ordered. Monitor and replace K PRN, check Mg level  GI bleeding Hematemesis - Admission 7/10 through 7/13 with patient signed out AMA Patient denies any recurrence of GI bleeding since.  EGD on 08/27/2022 showed portal hypertensive gastropathy, gastritis, small  hiatal hernia and grade 1 esophageal varices.  --Hbg stable.  Monitor.  Acute metabolic encephalopathy RESOLVED. Likely due to acute hepatitis, mild Hepatic encephalopathy (ammonia 44 on admission). --Mgmt of underlying issues as outlined --Delirium precautions  ETOH abuse Pt reported last alcoholic drink was about 2 weeks prior to admission.  Alcoholic cirrhosis of liver without ascites (HCC) See acute hepatitis. Follow GI recommendations.        Subjective: Pt awake resting in bed this AM.  He reports ongoing right sided abdominal pain.  No other acute complaints.     Physical Exam: Vitals:   09/07/22 1537 09/07/22 2013 09/08/22 0403 09/08/22 0751  BP: 112/66 125/71 115/69 122/67  Pulse: 77 81 68 72  Resp: 18 18 18 16   Temp: 98 F (36.7 C) 98.2 F (36.8 C) 98 F (36.7 C) 98.1 F (36.7 C)  TempSrc:  Oral Oral Oral  SpO2: 99% 99% 99% 95%  Weight:      Height:       General exam: awake, alert, no acute distress HEENT: icteric sclera, moist mucus membranes, hearing grossly normal  Respiratory system: on room air, normal respiratory effort. Cardiovascular system: RRR, no pedal edema.   Gastrointestinal system: soft, NT, ND Central nervous system: A&O x4. no gross focal neurologic deficits, normal speech Extremities: moves all, no edema, normal tone Skin: dry, intact, jaundiced Psychiatry: normal mood, congruent affect, judgement and insight appear normal '  Data Reviewed:  Notable labs ---   Bicarb 17 >> 18 >> 18 Albumin 2.5 AST 207 down slightly ALT 90 up slightly T bili 32.0 >> 34.2  Last Hbg 9.2 stable  Family Communication: none. Pt is able to update.  Disposition: Status is: Inpatient Remains inpatient appropriate because: closely monitoring liver function with acute hepatitis. T bili increasing today.   Planned Discharge Destination: Home    Time spent: 38 minutes  Author: Pennie Banter, DO 09/08/2022 1:23 PM  For on call review  [email or web address].

## 2022-09-09 ENCOUNTER — Other Ambulatory Visit: Payer: Self-pay

## 2022-09-09 LAB — COMPREHENSIVE METABOLIC PANEL
Alkaline Phosphatase: 116 U/L (ref 38–126)
Potassium: 3.5 mmol/L (ref 3.5–5.1)

## 2022-09-09 MED ORDER — OXYCODONE HCL 5 MG PO TABS
5.0000 mg | ORAL_TABLET | Freq: Three times a day (TID) | ORAL | 0 refills | Status: DC | PRN
  Filled 2022-09-09: qty 20, 7d supply, fill #0

## 2022-09-09 MED ORDER — IBUPROFEN 400 MG PO TABS: 400.0000 mg | ORAL_TABLET | Freq: Four times a day (QID) | ORAL | Status: DC | PRN

## 2022-09-09 MED ORDER — HYDROXYZINE HCL 25 MG PO TABS
25.0000 mg | ORAL_TABLET | Freq: Three times a day (TID) | ORAL | 2 refills | Status: DC | PRN
  Filled 2022-09-09: qty 30, 10d supply, fill #0

## 2022-09-09 MED ORDER — ADULT MULTIVITAMIN W/MINERALS CH: 1.0000 | ORAL_TABLET | Freq: Every day | ORAL | Status: AC

## 2022-09-09 MED ORDER — VITAMIN B-1 100 MG PO TABS
100.0000 mg | ORAL_TABLET | Freq: Every day | ORAL | 2 refills | Status: DC
  Filled 2022-09-09: qty 30, 30d supply, fill #0

## 2022-09-09 MED ORDER — PREDNISONE 20 MG PO TABS
40.0000 mg | ORAL_TABLET | Freq: Every day | ORAL | 0 refills | Status: AC
  Filled 2022-09-09: qty 56, 28d supply, fill #0

## 2022-09-09 MED ORDER — FOLIC ACID 1 MG PO TABS
1.0000 mg | ORAL_TABLET | Freq: Every day | ORAL | 2 refills | Status: DC
  Filled 2022-09-09: qty 30, 30d supply, fill #0

## 2022-09-09 NOTE — Progress Notes (Signed)
Progress Note   Patient: Stephen Mayer:[id number] DOB: 1975-05-23 DOA: 09/04/2022     5 DOS: the patient was seen and examined on 09/09/2022   Brief hospital course: Stephen Mayer is a 47 y.o. male with medical history significant for alcohol use disorder, GERD, osteonecrosis, pancreatic insufficiency, alcoholic liver cirrhosis, recent hospitalization on 08/24/2022 for hematemesis.  Dr. Kathi Ludwig, EGD showed portal hypertensive gastropathy, gastritis, small hiatal hernia and grade 1 esophageal varices.  Unfortunately, patient left AGAINST MEDICAL ADVICE on 08/27/2022. He presented to the hospital again on 09/04/2022 because of right upper quadrant abdominal pain, confusion, jaundice and yellow discoloration of the eyes.   He was found to have hyperbilirubinemia with bilirubin of 31.1.  Further hospital course and management as outlined below.    Assessment and Plan: * Acute hepatitis Acute alcoholic hepatitis Doppler U/S negative for portal vein thrombosis.   Alcohol undetectable this admission. Full viral work up negative including - Hep B core Ab , surface Ag negative, Hep B E Ag and Ab, HCV RNA, Hep B DNA, HSV, VZV, CMV, EBV  --GI is following - see their recs --Prednisone 40 mg daily started 7/24 --Optimize nutrition, protein intake  --Absolutely abstain from alcohol  If patient has chronic hep C, it needs to be treated --Avoid hepatotoxic agents --Monitor LFTs daily  Hyperbilirubinemia See acute hepatitis  Elevated LFTs See acute hepatitis T bili on admission 31.1 >> improved to nadir on 27.4, now increasing again to 32.0 today (7/24). --Daily CMP to monitor  Hypokalemia K 3.2 this AM (7/24). PO replacement ordered. Monitor and replace K PRN, check Mg level  GI bleeding Hematemesis - Admission 7/10 through 7/13 with patient signed out AMA Patient denies any recurrence of GI bleeding since.  EGD on 08/27/2022 showed portal hypertensive gastropathy, gastritis, small  hiatal hernia and grade 1 esophageal varices.  --Hbg stable.  Monitor.  Acute metabolic encephalopathy RESOLVED. Likely due to acute hepatitis, mild Hepatic encephalopathy (ammonia 44 on admission). --Mgmt of underlying issues as outlined --Delirium precautions  ETOH abuse Pt reported last alcoholic drink was about 2 weeks prior to admission.  Alcoholic cirrhosis of liver without ascites (HCC) See acute hepatitis. Follow GI recommendations.        Subjective: Pt seen ambulating around the unit this AM.  Reports improved itching with Atarax and couple showers daily.  Ongoing right-sided abdominal pain controlled on current meds.  No other acute complaints.   Physical Exam: Vitals:   09/08/22 1610 09/08/22 2026 09/09/22 0422 09/09/22 0802  BP: (!) 106/58 124/61 112/63 117/76  Pulse: 79 71 69 86  Resp: 16 18 20 16   Temp:  98 F (36.7 C) 97.8 F (36.6 C) 97.6 F (36.4 C)  TempSrc:  Oral Oral Oral  SpO2: 96% 97% 98% 100%  Weight:      Height:       General exam: awake, alert, no acute distress HEENT: icteric sclera, moist mucus membranes, hearing grossly normal  Respiratory system: on room air, normal respiratory effort. Cardiovascular system: RRR, no pedal edema.   Gastrointestinal system: soft, NT, ND Central nervous system: A&O x4. no gross focal neurologic deficits, normal speech Extremities: moves all, no edema, normal tone Skin: dry, intact, jaundiced Psychiatry: normal mood, congruent affect, judgement and insight appear normal '  Data Reviewed:  Notable labs ---   Bicarb 17 >> 18 >> 18 >> 19 Albumin 2.3 AST 207 >> 188 improving ALT 90 >> 88 down slightly T bili 32.0 >> 34.2 (prednisone  started) >> 29.9  Last Hbg 9.2 stable  Family Communication: none. Pt is able to update.  Disposition: Status is: Inpatient Remains inpatient appropriate because: closely monitoring liver function with acute hepatitis. T bili increasing today.   Planned Discharge  Destination: Home    Time spent: 38 minutes  Author: Pennie Banter, DO 09/09/2022 11:46 AM  For on call review [email or web address].

## 2022-09-10 MED ORDER — POTASSIUM CHLORIDE CRYS ER 20 MEQ PO TBCR
40.0000 meq | EXTENDED_RELEASE_TABLET | ORAL | Status: AC
  Administered 2022-09-10 (×2): 40 meq via ORAL
  Filled 2022-09-10 (×2): qty 2

## 2022-09-10 NOTE — Progress Notes (Signed)
Inpatient Follow-up/Progress Note   Patient ID: Stephen Mayer is a 47 y.o. male.  Overnight Events / Subjective Findings NAEON. Pt resting in bed comfortably.  Tolerating p.o.  Reports bowel movements are brown in nature.  Currently on 40 mg prednisone for alcoholic hepatitis and Atarax for pruritus related to hyperbilirubinemia. No other acute GI complaints  Review of Systems  Constitutional:  Negative for appetite change, chills, diaphoresis, fatigue, fever and unexpected weight change.  HENT:  Negative for trouble swallowing.   Respiratory:  Negative for shortness of breath and wheezing.   Cardiovascular:  Negative for chest pain and palpitations.  Gastrointestinal:  Negative for abdominal distention, abdominal pain, anal bleeding, blood in stool, constipation, diarrhea, nausea and vomiting.  Musculoskeletal:  Negative for arthralgias and myalgias.  Skin:  Positive for color change. Negative for pallor.  Neurological:  Negative for dizziness, syncope and weakness.  Psychiatric/Behavioral:  Negative for confusion. The patient is not nervous/anxious.   All other systems reviewed and are negative.    Medications  Current Facility-Administered Medications:    enoxaparin (LOVENOX) injection 40 mg, 40 mg, Subcutaneous, Q24H, Floydene Flock, MD, 40 mg at 09/09/22 2106   folic acid (FOLVITE) tablet 1 mg, 1 mg, Oral, Daily, Pilar Jarvis, MD, 1 mg at 09/10/22 1610   hydrOXYzine (ATARAX) tablet 25 mg, 25 mg, Oral, QHS, Esaw Grandchild A, DO, 25 mg at 09/09/22 2106   hydrOXYzine (ATARAX) tablet 25 mg, 25 mg, Oral, TID PRN, Toney Reil, MD, 25 mg at 09/09/22 0944   ibuprofen (ADVIL) tablet 400 mg, 400 mg, Oral, Q6H PRN, Lurene Shadow, MD, 400 mg at 09/05/22 1616   multivitamin with minerals tablet 1 tablet, 1 tablet, Oral, Daily, Pilar Jarvis, MD, 1 tablet at 09/10/22 0952   ondansetron (ZOFRAN) tablet 4 mg, 4 mg, Oral, Q6H PRN **OR** ondansetron (ZOFRAN) injection 4 mg, 4 mg,  Intravenous, Q6H PRN, Floydene Flock, MD   oxyCODONE (Oxy IR/ROXICODONE) immediate release tablet 5 mg, 5 mg, Oral, Q8H PRN, Esaw Grandchild A, DO, 5 mg at 09/10/22 0953   pantoprazole (PROTONIX) EC tablet 40 mg, 40 mg, Oral, BID, Tressie Ellis, RPH, 40 mg at 09/10/22 9604   potassium chloride SA (KLOR-CON M) CR tablet 40 mEq, 40 mEq, Oral, Q4H, Esaw Grandchild A, DO, 40 mEq at 09/10/22 5409   predniSONE (DELTASONE) tablet 40 mg, 40 mg, Oral, Q breakfast, Vanga, Loel Dubonnet, MD, 40 mg at 09/10/22 8119   thiamine (VITAMIN B1) tablet 100 mg, 100 mg, Oral, Daily, 100 mg at 09/10/22 1478 **OR** thiamine (VITAMIN B1) injection 100 mg, 100 mg, Intravenous, Daily, Pilar Jarvis, MD   hydrOXYzine, ibuprofen, ondansetron **OR** ondansetron (ZOFRAN) IV, oxyCODONE   Objective    Vitals:   09/09/22 1553 09/09/22 1952 09/10/22 0418 09/10/22 0859  BP: 117/63 123/64 112/64 (!) 109/58  Pulse: 67 69 66 65  Resp: 16 18 20 16   Temp: 98.1 F (36.7 C) 98 F (36.7 C) 98.6 F (37 C) 98.5 F (36.9 C)  TempSrc:   Oral Oral  SpO2: 96% 99% 99% 98%  Weight:      Height:         Physical Exam Vitals and nursing note reviewed.  Constitutional:      General: He is not in acute distress.    Appearance: Normal appearance. He is ill-appearing (chronically). He is not toxic-appearing or diaphoretic.  HENT:     Head: Normocephalic and atraumatic.     Nose: Nose normal.  Mouth/Throat:     Mouth: Mucous membranes are moist.     Pharynx: Oropharynx is clear.  Eyes:     General: Scleral icterus present.     Extraocular Movements: Extraocular movements intact.  Cardiovascular:     Rate and Rhythm: Normal rate and regular rhythm.     Heart sounds: Normal heart sounds. No murmur heard.    No friction rub. No gallop.  Pulmonary:     Effort: Pulmonary effort is normal. No respiratory distress.     Breath sounds: Normal breath sounds. No wheezing, rhonchi or rales.  Abdominal:     General: Bowel sounds  are normal. There is no distension.     Palpations: Abdomen is soft.     Tenderness: There is no abdominal tenderness. There is no guarding or rebound.  Musculoskeletal:     Cervical back: Neck supple.     Right lower leg: Edema present.     Left lower leg: Edema present.  Skin:    General: Skin is warm and dry.     Coloration: Skin is jaundiced. Skin is not pale.     Comments: Telangiectasias on the face and chest  Neurological:     General: No focal deficit present.     Mental Status: He is alert and oriented to person, place, and time. Mental status is at baseline.  Psychiatric:        Mood and Affect: Mood normal.        Behavior: Behavior normal.        Thought Content: Thought content normal.        Judgment: Judgment normal.      Laboratory Data Recent Labs  Lab 09/04/22 1205 09/05/22 0627  WBC 7.1 4.9  HGB 10.4* 9.2*  HCT 31.8* 28.5*  PLT 205 177   Recent Labs  Lab 09/08/22 0401 09/09/22 0347 09/10/22 0415  NA 135 139 134*  K 3.6 3.5 3.1*  CL 109 107 106  CO2 18* 19* 18*  BUN 12 17 18   CREATININE 0.50* 0.65 0.79  CALCIUM 8.9 9.3 8.7*  PROT 7.2 6.8 6.9  BILITOT 34.2* 29.9* 29.6*  ALKPHOS 113 116 115  ALT 90* 88* 89*  AST 207* 188* 185*  GLUCOSE 98 88 84   Recent Labs  Lab 09/04/22 1441 09/08/22 1312  INR 1.2 1.2      Imaging Studies: No results found.  Assessment:   Patient Active Problem List   Diagnosis Date Noted   Alcoholic hepatitis without ascites 09/08/2022   Acute metabolic encephalopathy 09/07/2022   Acute alcoholic hepatitis 09/07/2022   Acute hepatitis 09/05/2022   ETOH abuse 09/04/2022   Hyperbilirubinemia 09/04/2022   GI bleed 08/26/2022   Pancreatic insufficiency 08/25/2022   Chronic pain 08/25/2022   Hypokalemia 08/25/2022   Metabolic acidosis 08/25/2022   Elevated LFTs 08/25/2022   Alcoholic cirrhosis of liver without ascites (HCC) 08/25/2022   Hematemesis with nausea 08/25/2022   Pancytopenia (HCC) 08/25/2022    GI bleeding 08/24/2022   # EtOH Hepatitis - MDF 46 on presentation - hcv ab +, hcv rna negative - hepatitis b negative - hsv, vzv, cmv, ebv all negative  # Acute encephalopathy- resolved  # pruritus  # Portal htn with g1 varices, phg, hyper bili, hypoalb, hypona, encephlopathy, and thrombocytopenia   Plan:  On prednisone (40mg ) for alcoholic hepatitis with Mdf 46 Discussed continued etoh cessation Lille score planned for tmrw to determine if continued prednisone warranted- collect CMP and PT/INR - HCV ab +  but RNA negative Other serologic markers negative High protein diet; tolerating po without issue Expect total bilirubin lab value to wax and wane with overall downtrend (may take months) Protonix 40 mg po daily Atarax 25-50mg  tid prn Symptom control including antiemetics and pain control as per primary team  I personally performed the service.  Management of other medical comorbidities as per primary team  Thank you for allowing [place] to participate in this patient's care. Please don't hesitate to call if any questions or concerns arise.   Jaynie Collins, DO [facility]  Portions of the record may have been created with voice recognition software. Occasional wrong-word or 'sound-a-like' substitutions may have occurred due to the inherent limitations of voice recognition software.  Read the chart carefully and recognize, using context, where substitutions may have occurred.

## 2022-09-10 NOTE — Progress Notes (Addendum)
Progress Note   Patient: Stephen Mayer:[id number] DOB: [birth year] DOA: 09/04/2022     6 DOS: the patient was seen and examined on 09/10/2022   Brief hospital course: Stephen Mayer is a 47 y.o. male with medical history significant for alcohol use disorder, GERD, osteonecrosis, pancreatic insufficiency, alcoholic liver cirrhosis, recent hospitalization on 08/24/2022 for hematemesis.  Dr. Kathi Ludwig, EGD showed portal hypertensive gastropathy, gastritis, small hiatal hernia and grade 1 esophageal varices.  Unfortunately, patient left AGAINST MEDICAL ADVICE on 08/27/2022. He presented to the hospital again on 09/04/2022 because of right upper quadrant abdominal pain, confusion, jaundice and yellow discoloration of the eyes.   He was found to have hyperbilirubinemia with bilirubin of 31.1.  Further hospital course and management as outlined below.    Assessment and Plan: * Acute hepatitis Acute alcoholic hepatitis Doppler U/S negative for portal vein thrombosis.   Alcohol undetectable this admission. Full viral work up negative including - Hep B core Ab , surface Ag negative, Hep B E Ag and Ab, HCV RNA, Hep B DNA, HSV, VZV, CMV, EBV  --GI is following - see their recs --Prednisone 40 mg daily started 7/24 --Optimize nutrition, protein intake  --Absolutely abstain from alcohol  If patient has chronic hep C, it needs to be treated --Avoid hepatotoxic agents --Monitor LFTs daily  Hyperbilirubinemia See acute hepatitis  Elevated LFTs See acute hepatitis T bili on admission 31.1 >> improved to nadir on 27.4, now increasing again to 32.0 today (7/24). --Daily CMP to monitor  Hypokalemia K 3.1 this AM (7/27). PO replacement ordered. Monitor and replace K PRN, check Mg level  GI bleeding Hematemesis - Admission 7/10 through 7/13 with patient signed out AMA Patient denies any recurrence of GI bleeding since.  EGD on 08/27/2022 showed portal hypertensive gastropathy, gastritis, small  hiatal hernia and grade 1 esophageal varices.  --Hbg stable.  Monitor.  Acute metabolic encephalopathy RESOLVED. Likely due to acute hepatitis, mild Hepatic encephalopathy (ammonia 44 on admission). --Mgmt of underlying issues as outlined --Delirium precautions  ETOH abuse Pt reported last alcoholic drink was about 2 weeks prior to admission.  Alcoholic cirrhosis of liver without ascites (HCC) See acute hepatitis. Follow GI recommendations.        Subjective: Pt awake sitting up in bed this AM.  Reports doing well overall, hoping his numbers continue to improve.  No acute complaints this AM.   Physical Exam: Vitals:   09/09/22 1553 09/09/22 1952 09/10/22 0418 09/10/22 0859  BP: 117/63 123/64 112/64 (!) 109/58  Pulse: 67 69 66 65  Resp: 16 18 20 16   Temp: 98.1 F (36.7 C) 98 F (36.7 C) 98.6 F (37 C) 98.5 F (36.9 C)  TempSrc:   Oral Oral  SpO2: 96% 99% 99% 98%  Weight:      Height:       General exam: awake, alert, no acute distress HEENT: icteric sclera, moist mucus membranes, hearing grossly normal  Respiratory system: on room air, normal respiratory effort. Cardiovascular system: RRR, no pedal edema.   Gastrointestinal system: soft, NT, ND Central nervous system: A&O x4. no gross focal neurologic deficits, normal speech Extremities: moves all, no edema, normal tone Skin: dry, intact, jaundiced Psychiatry: normal mood, congruent affect, judgement and insight appear normal '  Data Reviewed:  Notable labs ---   Na 134 K 3.1 Bicarb 17 >> 18 >> 18 >> 19 >> 18 Albumin 2.4 AST 207 >> 188 >> 185 improving ALT 90 >> 88 >> 89 T bili  32.0 >> 34.2 (prednisone started) >> 29.9 >> 29.6  Last Hbg 9.2 stable  Family Communication: none. Pt is able to update.  Disposition: Status is: Inpatient Remains inpatient appropriate because: closely monitoring liver function with acute hepatitis. T bili increasing today.   Planned Discharge Destination:  Home    Time spent: 35 minutes  Author: Pennie Banter, DO 09/10/2022 10:50 AM  For on call review [email or web address].

## 2022-09-10 NOTE — Plan of Care (Signed)

## 2022-09-11 LAB — COMPREHENSIVE METABOLIC PANEL WITH GFR
ALT: 92 U/L — ABNORMAL HIGH (ref 0–44)
AST: 175 U/L — ABNORMAL HIGH (ref 15–41)
Albumin: 2.4 g/dL — ABNORMAL LOW (ref 3.5–5.0)
Alkaline Phosphatase: 113 U/L (ref 38–126)
Anion gap: 6 (ref 5–15)
BUN: 18 mg/dL (ref 6–20)
CO2: 20 mmol/L — ABNORMAL LOW (ref 22–32)
Calcium: 8.5 mg/dL — ABNORMAL LOW (ref 8.9–10.3)
Chloride: 110 mmol/L (ref 98–111)
Creatinine, Ser: 0.8 mg/dL (ref 0.61–1.24)
GFR, Estimated: >60 mL/min (ref >60)
Glucose, Bld: 85 mg/dL (ref 70–99)
Potassium: 3.4 mmol/L — ABNORMAL LOW (ref 3.5–5.1)
Sodium: 136 mmol/L (ref 135–145)
Total Bilirubin: 28.2 mg/dL (ref 0.3–1.2)
Total Protein: 7.1 g/dL (ref 6.5–8.1)

## 2022-09-11 LAB — PROTIME-INR
INR: 1.2 (ref 0.8–1.2)
Prothrombin Time: 15.2 s (ref 11.4–15.2)

## 2022-09-11 MED ORDER — POTASSIUM CHLORIDE CRYS ER 20 MEQ PO TBCR
40.0000 meq | EXTENDED_RELEASE_TABLET | Freq: Once | ORAL | Status: AC
  Administered 2022-09-11: 40 meq via ORAL
  Filled 2022-09-11: qty 2

## 2022-09-11 NOTE — Plan of Care (Signed)

## 2022-09-11 NOTE — Progress Notes (Signed)
Progress Note   Patient: Stephen Mayer YQM:[id number] DOB: [birth year] DOA: 09/04/2022     7 DOS: the patient was seen and examined on 09/11/2022   Brief hospital course: Stephen Mayer is a 47 y.o. male with medical history significant for alcohol use disorder, GERD, osteonecrosis, pancreatic insufficiency, alcoholic liver cirrhosis, recent hospitalization on 08/24/2022 for hematemesis.  Dr. Kathi Ludwig, EGD showed portal hypertensive gastropathy, gastritis, small hiatal hernia and grade 1 esophageal varices.  Unfortunately, patient left AGAINST MEDICAL ADVICE on 08/27/2022. He presented to the hospital again on 09/04/2022 because of right upper quadrant abdominal pain, confusion, jaundice and yellow discoloration of the eyes.   He was found to have hyperbilirubinemia with bilirubin of 31.1.  Further hospital course and management as outlined below.    Assessment and Plan: * Acute hepatitis Acute alcoholic hepatitis Doppler U/S negative for portal vein thrombosis.   Alcohol undetectable this admission. Full viral work up negative including - Hep B core Ab , surface Ag negative, Hep B E Ag and Ab, HCV RNA, Hep B DNA, HSV, VZV, CMV, EBV  --GI is following - see their recs --Prednisone 40 mg daily started 7/24 --Optimize nutrition, protein intake  --Absolutely abstain from alcohol  If patient has chronic hep C, it needs to be treated --Avoid hepatotoxic agents --Monitor LFTs daily  Hyperbilirubinemia See acute hepatitis  Elevated LFTs See acute hepatitis T bili on admission 31.1 >> improved to nadir on 27.4, now increasing again to 32.0 today (7/24). --Daily CMP to monitor  Hypokalemia K 3.1 this AM (7/27). PO replacement ordered. Monitor and replace K PRN, check Mg level  GI bleeding Hematemesis - Admission 7/10 through 7/13 with patient signed out AMA Patient denies any recurrence of GI bleeding since.  EGD on 08/27/2022 showed portal hypertensive gastropathy, gastritis, small  hiatal hernia and grade 1 esophageal varices.  --Hbg stable.  Monitor.  Acute metabolic encephalopathy RESOLVED. Likely due to acute hepatitis, mild Hepatic encephalopathy (ammonia 44 on admission). --Mgmt of underlying issues as outlined --Delirium precautions  ETOH abuse Pt reported last alcoholic drink was about 2 weeks prior to admission.  Alcoholic cirrhosis of liver without ascites (HCC) See acute hepatitis. Follow GI recommendations.        Subjective: Pt awake up in recliner this AM.  Right-sided pain adequately controlled with medication.  No other acute complaints.  Hopeful his numbers continue to improve.     Physical Exam: Vitals:   09/10/22 1626 09/10/22 2102 09/11/22 0434 09/11/22 0829  BP: 113/66 123/65 120/71 126/69  Pulse: 63 67 66 65  Resp: 16 18 16 18   Temp: 98.1 F (36.7 C) 97.8 F (36.6 C) 98.2 F (36.8 C) 98.6 F (37 C)  TempSrc: Oral Oral Oral   SpO2: 97% 100% 100% 100%  Weight:      Height:       General exam: awake, alert, no acute distress HEENT: icteric sclera, moist mucus membranes, hearing grossly normal  Respiratory system: on room air, normal respiratory effort. Cardiovascular system: RRR, no pedal edema.   Gastrointestinal system: soft, NT, ND Central nervous system: A&O x4. no gross focal neurologic deficits, normal speech Extremities: moves all, no edema, normal tone Skin: dry, intact, jaundiced Psychiatry: normal mood, congruent affect, judgement and insight appear normal '  Data Reviewed:  Notable labs ---   K 3.4 Bicarb 18 >> 20 Albumin 2.4 AST 207 >> 188 >> 185 >> 175 improving ALT 90 >> 88 >> 89 >> 92 T bili 32.0 >>  34.2 (prednisone started) >> 29.9 >> 29.6 >> 28.2  Last Hbg 9.2 stable  Family Communication: none. Pt is able to update.  Disposition: Status is: Inpatient Remains inpatient appropriate because: closely monitoring liver function with acute hepatitis. T bili increasing today.   Planned Discharge  Destination: Home    Time spent: 35 minutes  Author: Pennie Banter, DO 09/11/2022 11:07 AM  For on call review [email or web address].

## 2022-09-11 NOTE — Progress Notes (Signed)
Inpatient Follow-up/Progress Note   Patient ID: Stephen Mayer is a 47 y.o. male.  Overnight Events / Subjective Findings NAEON. Pt resting in bed comfortably.  Tolerating p.o.   No current GI complaints Reports bowel movements are brown in nature.  Currently on 40 mg prednisone for alcoholic hepatitis and Atarax for pruritus related to hyperbilirubinemia. No other acute GI complaints  Review of Systems  Constitutional:  Negative for appetite change, chills, diaphoresis, fatigue, fever and unexpected weight change.  HENT:  Negative for trouble swallowing.   Respiratory:  Negative for shortness of breath and wheezing.   Cardiovascular:  Negative for chest pain and palpitations.  Gastrointestinal:  Negative for abdominal distention, abdominal pain, anal bleeding, blood in stool, constipation, diarrhea, nausea and vomiting.  Musculoskeletal:  Negative for arthralgias and myalgias.  Skin:  Positive for color change. Negative for pallor.  Neurological:  Negative for dizziness, syncope and weakness.  Psychiatric/Behavioral:  Negative for confusion. The patient is not nervous/anxious.   All other systems reviewed and are negative.    Medications  Current Facility-Administered Medications:    enoxaparin (LOVENOX) injection 40 mg, 40 mg, Subcutaneous, Q24H, Floydene Flock, MD, 40 mg at 09/10/22 2048   folic acid (FOLVITE) tablet 1 mg, 1 mg, Oral, Daily, Pilar Jarvis, MD, 1 mg at 09/10/22 0932   hydrOXYzine (ATARAX) tablet 25 mg, 25 mg, Oral, QHS, Esaw Grandchild A, DO, 25 mg at 09/10/22 2047   hydrOXYzine (ATARAX) tablet 25 mg, 25 mg, Oral, TID PRN, Toney Reil, MD, 25 mg at 09/09/22 0944   ibuprofen (ADVIL) tablet 400 mg, 400 mg, Oral, Q6H PRN, Lurene Shadow, MD, 400 mg at 09/05/22 1616   multivitamin with minerals tablet 1 tablet, 1 tablet, Oral, Daily, Pilar Jarvis, MD, 1 tablet at 09/10/22 0952   ondansetron (ZOFRAN) tablet 4 mg, 4 mg, Oral, Q6H PRN **OR** ondansetron  (ZOFRAN) injection 4 mg, 4 mg, Intravenous, Q6H PRN, Floydene Flock, MD   oxyCODONE (Oxy IR/ROXICODONE) immediate release tablet 5 mg, 5 mg, Oral, Q8H PRN, Esaw Grandchild A, DO, 5 mg at 09/10/22 2054   pantoprazole (PROTONIX) EC tablet 40 mg, 40 mg, Oral, BID, Tressie Ellis, RPH, 40 mg at 09/10/22 2048   potassium chloride SA (KLOR-CON M) CR tablet 40 mEq, 40 mEq, Oral, Once, Esaw Grandchild A, DO   predniSONE (DELTASONE) tablet 40 mg, 40 mg, Oral, Q breakfast, Vanga, Loel Dubonnet, MD, 40 mg at 09/10/22 3557   thiamine (VITAMIN B1) tablet 100 mg, 100 mg, Oral, Daily, 100 mg at 09/10/22 3220 **OR** thiamine (VITAMIN B1) injection 100 mg, 100 mg, Intravenous, Daily, Pilar Jarvis, MD   hydrOXYzine, ibuprofen, ondansetron **OR** ondansetron (ZOFRAN) IV, oxyCODONE   Objective    Vitals:   09/10/22 0859 09/10/22 1626 09/10/22 2102 09/11/22 0434  BP: (!) 109/58 113/66 123/65 120/71  Pulse: 65 63 67 66  Resp: 16 16 18 16   Temp: 98.5 F (36.9 C) 98.1 F (36.7 C) 97.8 F (36.6 C) 98.2 F (36.8 C)  TempSrc: Oral Oral Oral Oral  SpO2: 98% 97% 100% 100%  Weight:      Height:         Physical Exam Vitals and nursing note reviewed.  Constitutional:      General: He is not in acute distress.    Appearance: Normal appearance. He is ill-appearing (chronically). He is not toxic-appearing or diaphoretic.  HENT:     Head: Normocephalic and atraumatic.     Nose: Nose normal.  Mouth/Throat:     Mouth: Mucous membranes are moist.     Pharynx: Oropharynx is clear.  Eyes:     General: Scleral icterus present.     Extraocular Movements: Extraocular movements intact.  Cardiovascular:     Rate and Rhythm: Normal rate and regular rhythm.     Heart sounds: Normal heart sounds. No murmur heard.    No friction rub. No gallop.  Pulmonary:     Effort: Pulmonary effort is normal. No respiratory distress.     Breath sounds: Normal breath sounds. No wheezing, rhonchi or rales.  Abdominal:      General: Bowel sounds are normal. There is no distension.     Palpations: Abdomen is soft.     Tenderness: There is no abdominal tenderness. There is no guarding or rebound.  Musculoskeletal:     Cervical back: Neck supple.     Right lower leg: Edema present.     Left lower leg: Edema present.  Skin:    General: Skin is warm and dry.     Coloration: Skin is jaundiced. Skin is not pale.     Comments: Telangiectasias on the face and chest  Neurological:     General: No focal deficit present.     Mental Status: He is alert and oriented to person, place, and time. Mental status is at baseline.  Psychiatric:        Mood and Affect: Mood normal.        Behavior: Behavior normal.        Thought Content: Thought content normal.        Judgment: Judgment normal.      Laboratory Data Recent Labs  Lab 09/04/22 1205 09/05/22 0627  WBC 7.1 4.9  HGB 10.4* 9.2*  HCT 31.8* 28.5*  PLT 205 177   Recent Labs  Lab 09/09/22 0347 09/10/22 0415 09/11/22 0433  NA 139 134* 136  K 3.5 3.1* 3.4*  CL 107 106 110  CO2 19* 18* 20*  BUN 17 18 18   CREATININE 0.65 0.79 0.80  CALCIUM 9.3 8.7* 8.5*  PROT 6.8 6.9 7.1  BILITOT 29.9* 29.6* 28.2*  ALKPHOS 116 115 113  ALT 88* 89* 92*  AST 188* 185* 175*  GLUCOSE 88 84 85   Recent Labs  Lab 09/04/22 1441 09/08/22 1312 09/11/22 0433  INR 1.2 1.2 1.2      Imaging Studies: No results found.  Assessment:   # EtOH Hepatitis - MDF 46 on presentation - Lille score (d5) 0.982- poor responder - hcv ab +, hcv rna negative - hepatitis b negative - hsv, vzv, cmv, ebv all negative  # Acute encephalopathy- resolved  # pruritus  # Portal htn with g1 varices, phg, hyper bili, hypoalb, hypona, encephlopathy, and thrombocytopenia   Plan:  On prednisone (40mg ) for alcoholic hepatitis with Mdf 46 On day 5 a low score has not improved.  Generalizes performed on day 7.  Does not appear to be having response to prednisone.  Can consider continuing  for 2 more days and reassessing. Liver enzymes otherwise slowly improving Discussed continued etoh cessation  HCV ab + but RNA negative Other serologic markers negative High protein diet; tolerating po without issue Expect total bilirubin lab value to wax and wane with overall downtrend (may take months) Protonix 40 mg po daily Atarax 25-50mg  tid prn Symptom control including antiemetics and pain control as per primary team   GI service to follow-up with patient after the weekend.  I personally performed  the service.  Management of other medical comorbidities as per primary team  Thank you for allowing [place] to participate in this patient's care. Please don't hesitate to call if any questions or concerns arise.   Jaynie Collins, DO [facility]  Portions of the record may have been created with voice recognition software. Occasional wrong-word or 'sound-a-like' substitutions may have occurred due to the inherent limitations of voice recognition software.  Read the chart carefully and recognize, using context, where substitutions may have occurred.

## 2022-09-12 NOTE — Progress Notes (Signed)
   Wyline Mood , MD [address], [place], [place], [address] [address], [address], [place], [place], [address] Phone: [phone]  Fax: [phone]   Stephen Mayer is being followed for alcoholic hepatitis   Subjective: Feels well    Objective: Vital signs in last 24 hours: Vitals:   09/11/22 0829 09/11/22 1558 09/11/22 2031 09/12/22 0447  BP: 126/69 116/65 118/71 112/72  Pulse: 65 63 76 (!) 56  Resp: 18 18 20 16   Temp: 98.6 F (37 C) 98.1 F (36.7 C) 98 F (36.7 C) 98.3 F (36.8 C)  TempSrc:   Oral Oral  SpO2: 100% 95% 98% 100%  Weight:      Height:       Weight change:  No intake or output data in the 24 hours ending 09/12/22 1042   Exam:  Abdomen: soft, nontender, normal bowel sounds   Lab Results: @LABTEST2 @ Micro Results: Recent Results (from the past 240 hour(s))  Varicella-zoster by PCR     Status: None   Collection Time: 09/04/22  6:44 PM   Specimen: Blood  Result Value Ref Range Status   Varicella-Zoster, PCR Negative Negative Final    Comment: (NOTE) No Varicella Zoster Virus DNA detected. This test was developed and its performance characteristics determined by [facility].  It has not been cleared or approved by the Food and Drug Administration.  The FDA has determined that such clearance or approval is not necessary. Performed At: [facility] [address] [place], [place] [id number] Jolene Schimke MD SW:[id number]    Studies/Results: No results found. Medications: I have reviewed the patient's current medications. Scheduled Meds:  enoxaparin (LOVENOX) injection  40 mg Subcutaneous Q24H   folic acid  1 mg Oral Daily   hydrOXYzine  25 mg Oral QHS   multivitamin with minerals  1 tablet Oral Daily   pantoprazole  40 mg Oral BID   predniSONE  40 mg Oral Q breakfast   thiamine  100 mg Oral Daily   Or   thiamine  100 mg Intravenous Daily   Continuous Infusions: PRN Meds:.hydrOXYzine, ibuprofen, ondansetron  **OR** ondansetron (ZOFRAN) IV, oxyCODONE   Assessment: Principal Problem:   Acute hepatitis Active Problems:   GI bleeding   Hypokalemia   Elevated LFTs   Alcoholic cirrhosis of liver without ascites (HCC)   ETOH abuse   Hyperbilirubinemia   Acute metabolic encephalopathy   Alcoholic hepatitis without ascites   Stephen Mayer 47 y.o. male with alcoholic hepatitis. Maddrey discriminant function 46 . Commenced on prednisone 09/08/2022 . Today is day 5 , need to check usually at day 7  -Lillie score today is  0.517 points Poor prognosis. The patient may be a non-responder to steroids.  We need to check his Jonna Clark score in 2 days , Wednesday morning to determine if he would benefit from long course of steroids vs d.c . In the meanwhile good nutrition and alcohol abstinence helps with outcomes.       LOS: 8 days   Wyline Mood, MD 09/12/2022, 10:42 AM

## 2022-09-12 NOTE — Progress Notes (Signed)
Progress Note   Patient: Stephen Mayer [id number]:[id number] DOB: Oct 30, 1975 DOA: 09/04/2022     8 DOS: the patient was seen and examined on 09/12/2022   Brief hospital course: Stephen Mayer is a 47 y.o. male with medical history significant for alcohol use disorder, GERD, osteonecrosis, pancreatic insufficiency, alcoholic liver cirrhosis, recent hospitalization on 08/24/2022 for hematemesis.  Dr. Kathi Ludwig, EGD showed portal hypertensive gastropathy, gastritis, small hiatal hernia and grade 1 esophageal varices.  Unfortunately, patient left AGAINST MEDICAL ADVICE on 08/27/2022. He presented to the hospital again on 09/04/2022 because of right upper quadrant abdominal pain, confusion, jaundice and yellow discoloration of the eyes.   He was found to have hyperbilirubinemia with bilirubin of 31.1.  Further hospital course and management as outlined below.    Assessment and Plan: * Acute hepatitis Acute alcoholic hepatitis Doppler U/S negative for portal vein thrombosis.   Alcohol undetectable this admission. Full viral work up negative including - Hep B core Ab , surface Ag negative, Hep B E Ag and Ab, HCV RNA, Hep B DNA, HSV, VZV, CMV, EBV  --GI is following - see their recs --Prednisone 40 mg daily started 7/24 --Optimize nutrition, protein intake  --Absolutely abstain from alcohol  If patient has chronic hep C, it needs to be treated --Avoid hepatotoxic agents --Monitor LFTs daily  Hyperbilirubinemia See acute hepatitis  Elevated LFTs See acute hepatitis T bili on admission 31.1 >> improved to nadir on 27.4, now increasing again to 32.0 today (7/24). --Daily CMP to monitor  Hypokalemia K 3.1 this AM (7/27). PO replacement ordered. Monitor and replace K PRN, check Mg level  GI bleeding Hematemesis - Admission 7/10 through 7/13 with patient signed out AMA Patient denies any recurrence of GI bleeding since.  EGD on 08/27/2022 showed portal hypertensive gastropathy, gastritis, small  hiatal hernia and grade 1 esophageal varices.  --Hbg stable.  Monitor.  Acute metabolic encephalopathy RESOLVED. Likely due to acute hepatitis, mild Hepatic encephalopathy (ammonia 44 on admission). --Mgmt of underlying issues as outlined --Delirium precautions  ETOH abuse Pt reported last alcoholic drink was about 2 weeks prior to admission.  Alcoholic cirrhosis of liver without ascites (HCC) See acute hepatitis. Follow GI recommendations.        Subjective: Pt awake up in recliner this AM.  Overall feels well.  His eyes feel less dry and itchy.  Feels like the jaundiced color of his abdomen is little improved as well.   Tolerating meals and ambulating around the unit.  No other acute complaints.  Agreeable to stay another night, but states he has a lot to do on Wednesday that he cannot miss.   Physical Exam: Vitals:   09/11/22 0829 09/11/22 1558 09/11/22 2031 09/12/22 0447  BP: 126/69 116/65 118/71 112/72  Pulse: 65 63 76 (!) 56  Resp: 18 18 20 16   Temp: 98.6 F (37 C) 98.1 F (36.7 C) 98 F (36.7 C) 98.3 F (36.8 C)  TempSrc:   Oral Oral  SpO2: 100% 95% 98% 100%  Weight:      Height:       General exam: awake, alert, no acute distress HEENT: icteric sclera, moist mucus membranes, hearing grossly normal  Respiratory system: on room air, normal respiratory effort. Cardiovascular system: RRR, no pedal edema.   Gastrointestinal system: soft, NT, ND Central nervous system: A&O x4. no gross focal neurologic deficits, normal speech Extremities: moves all, no edema, normal tone Skin: dry, intact, jaundiced Psychiatry: normal mood, congruent affect, judgement and insight appear  normal '  Data Reviewed:  Notable labs ---   Bicarb 18 >> 20 >> 18 Albumin 2.5 AST 207 >> 188 >> 185 >> 175 >> 173improving ALT 90 >> 88 >> 89 >> 92 >> 101 T bili 32.0 >> 34.2 (prednisone started) >> 29.9 >> 29.6 >> 28.2 >> 25.9  Last Hbg 9.2 stable  Family Communication: none. Pt is  able to update.  Disposition: Status is: Inpatient Remains inpatient appropriate because: closely monitoring liver function with acute hepatitis.  D/C pending GI's clearance   Planned Discharge Destination: Home    Time spent: 35 minutes  Author: Pennie Banter, DO 09/12/2022 12:41 PM  For on call review [email or web address].

## 2022-09-13 NOTE — Discharge Summary (Signed)
Physician Discharge Summary   Patient: Stephen Mayer MRN: [id number] DOB: [birth year]  Admit date:     09/04/2022  Discharge date: 09/13/22  Discharge Physician: Pennie Banter   PCP: Pcp, No   Recommendations at discharge:    Follow up with Gastroenterology Follow up with Primary Care Repeat CMP within 1 week   Patient signed out AMA   Discharge Diagnoses: Principal Problem:   Acute hepatitis Active Problems:   Hyperbilirubinemia   Elevated LFTs   Hypokalemia   GI bleeding   Alcoholic cirrhosis of liver without ascites (HCC)   ETOH abuse   Acute metabolic encephalopathy   Alcoholic hepatitis without ascites  Resolved Problems:   * No resolved hospital problems. [facility] Course:  Stephen Mayer is a 47 y.o. male with medical history significant for alcohol use disorder, GERD, osteonecrosis, pancreatic insufficiency, alcoholic liver cirrhosis, recent hospitalization on 08/24/2022 for hematemesis.  Dr. Kathi Ludwig, EGD showed portal hypertensive gastropathy, gastritis, small hiatal hernia and grade 1 esophageal varices.  Unfortunately, patient left AGAINST MEDICAL ADVICE on 08/27/2022. He presented to the hospital again on 09/04/2022 because of right upper quadrant abdominal pain, confusion, jaundice and yellow discoloration of the eyes.   He was found to have hyperbilirubinemia with bilirubin of 31.1.   Further hospital course and management as outlined below.   7/30 --- I was informed by patient's RN that he has signed himself out against medical advice.  Pt was provided meds previously filled from our pharmacy, as we had anticipated potential weekend discharge.  Pt is to follow up outpatient with GI.  He was advised to also establish with a PCP.   Assessment and Plan: * Acute hepatitis Acute alcoholic hepatitis Doppler U/S negative for portal vein thrombosis.   Alcohol undetectable this admission. Full viral work up negative including - Hep B core Ab , surface Ag  negative, Hep B E Ag and Ab, HCV RNA, Hep B DNA, HSV, VZV, CMV, EBV  --GI is following - see their recs --Prednisone 40 mg daily started 7/24 --Optimize nutrition, protein intake  --Absolutely abstain from alcohol  If patient has chronic hep C, it needs to be treated --Avoid hepatotoxic agents --Monitor LFTs daily  Hyperbilirubinemia See acute hepatitis  Elevated LFTs See acute hepatitis T bili on admission 31.1 >> improved to nadir on 27.4, now increasing again to 32.0 today (7/24). --Daily CMP to monitor  Hypokalemia K 3.1 this AM (7/27). PO replacement ordered. Monitor and replace K PRN, check Mg level  GI bleeding Hematemesis - Admission 7/10 through 7/13 with patient signed out AMA Patient denies any recurrence of GI bleeding since.  EGD on 08/27/2022 showed portal hypertensive gastropathy, gastritis, small hiatal hernia and grade 1 esophageal varices.  --Hbg stable.  Monitor.  Acute metabolic encephalopathy RESOLVED. Likely due to acute hepatitis, mild Hepatic encephalopathy (ammonia 44 on admission). --Mgmt of underlying issues as outlined --Delirium precautions  ETOH abuse Pt reported last alcoholic drink was about 2 weeks prior to admission.  Alcoholic cirrhosis of liver without ascites (HCC) See acute hepatitis. Follow GI recommendations.         Consultants: GI  Procedures performed: None  Disposition: Home Diet recommendation:  Discharge Diet Orders (From admission, onward)     Start     Ordered   09/13/22 0000  Diet - low sodium heart healthy        09/13/22 1151            DISCHARGE MEDICATION: Allergies as  of 09/13/2022   No Known Allergies      Medication List     STOP taking these medications    diphenhydrAMINE 25 mg capsule Commonly known as: BENADRYL   furosemide 20 MG tablet Commonly known as: Lasix       TAKE these medications    folic acid 1 MG tablet Commonly known as: FOLVITE Take 1 tablet (1 mg total) by  mouth daily.   hydrOXYzine 25 MG tablet Commonly known as: ATARAX Take 1 tablet (25 mg total) by mouth 3 (three) times daily as needed (itching or sleep).   ibuprofen 400 MG tablet Commonly known as: ADVIL Take 1 tablet (400 mg total) by mouth every 6 (six) hours as needed for moderate pain.   multivitamin with minerals Tabs tablet Take 1 tablet by mouth daily.   oxyCODONE 5 MG immediate release tablet Commonly known as: Oxy IR/ROXICODONE Take 1 tablet (5 mg total) by mouth every 8 (eight) hours as needed for severe pain.   predniSONE 20 MG tablet Commonly known as: DELTASONE Take 2 tablets (40 mg total) by mouth daily with breakfast for 28 days.   PRILOSEC OTC PO Take 20 mg by mouth daily.   thiamine 100 MG tablet Commonly known as: VITAMIN B1 Take 1 tablet (100 mg total) by mouth daily.        Follow-up Information     Vanga, Loel Dubonnet, MD. Call.   Specialty: Gastroenterology Why: Call to schedule follow up Contact information: [address] [place] [address] [phone]         [facility]. Call.   Why: Call to establish with primary care as a new patient.   Request repeat labs in 1-2 weeks Contact information: [address] [place] [phone]               Discharge Exam: Ceasar Mons Weights   09/04/22 1157  Weight: 81.6 kg   SEE EXAM FROM TODAY"S PROGRESS NOTE --- no d/c physical exam performed as pt signed out AMA  Condition at discharge: stable  The results of significant diagnostics from this hospitalization (including imaging, microbiology, ancillary and laboratory) are listed below for reference.   Imaging Studies: US ABDOMEN LIMITED WITH LIVER DOPPLER  Result Date: 09/05/2022 CLINICAL DATA:  Abnormal liver function tests EXAM: ULTRASOUND ABDOMEN LIMITED RIGHT UPPER QUADRANT with liver Doppler COMPARISON:  Standard ultrasound 08/25/2022.  CT 08/24/2018 FINDINGS: Liver: Diffusely echogenic  hepatic parenchyma consistent with fatty liver infiltration. Portal vein is patent but diffusely has a reversed flow away from the liver. Diameter of the main portal vein of 1.1 cm. Portal vein velocities proximally 44.8 centimeters/second, mid 42.0 centimeters/second and distal 32.6 centimeters/second. Right portal vein velocity 41.0 centimeters/second and left 25.7 centimeters/second. Appropriate flow seen of the hepatic artery, splenic vein. Velocity of the hepatic artery of 142.8 centimeters/second, slightly elevated. Patent hepatic veins. Spleen measures 13.3 x 13.3 x 6.5 cm with a volume of 600 cc. No ascites identified. IMPRESSION: Reverse flow in the portal venous system.  No thrombus. Mild splenic enlargement.  Fatty liver infiltration. Electronically Signed   By: Karen Kays M.D.   On: 09/05/2022 12:09   US LIVER DOPPLER LIMITED (PV OR SINGLE ART/VEIN)  Result Date: 08/25/2022 CLINICAL DATA:  Right upper quadrant pain Cirrhosis EXAM: DUPLEX ULTRASOUND OF LIVER TECHNIQUE: Color and duplex Doppler ultrasound was performed to evaluate the hepatic in-flow and out-flow vessels. COMPARISON:  CT abdomen pelvis 08/24/2022 FINDINGS: Liver: Diffuse heterogeneity of hepatic parenchyma.  Nodular contours. No focal hepatic lesion. Main Portal Vein size: 1.0 cm Portal Vein Velocities Main Prox:  24 cm/sec Main Mid: 27 cm/sec Main Dist:  16 cm/sec Right: 30 cm/sec Left: 21 cm/sec Portal vein only imaging per request. IMPRESSION: 1. Patent portal vein with appropriate direction of flow. 2.  Cirrhotic liver morphology without focal hepatic lesion. Electronically Signed   By: Acquanetta Belling M.D.   On: 08/25/2022 13:19   CT ABDOMEN PELVIS W CONTRAST  Result Date: 08/24/2022 CLINICAL DATA:  Abdominal pain, acute, nonlocalized EXAM: CT ABDOMEN AND PELVIS WITH CONTRAST TECHNIQUE: Multidetector CT imaging of the abdomen and pelvis was performed using the standard protocol following bolus administration of intravenous  contrast. RADIATION DOSE REDUCTION: This exam was performed according to the departmental dose-optimization program which includes automated exposure control, adjustment of the mA and/or kV according to patient size and/or use of iterative reconstruction technique. CONTRAST:  OMNIPAQUE IOHEXOL 300 MG/ML  SOLN COMPARISON:  CT dated November 16, 2005. FINDINGS: Lower chest: No acute abnormality. Hepatobiliary: Hepatic steatosis. Nodular liver contour. Recanalized paraumbilical vein. Main portal vein is grossly patent with multiple adjacent collaterals in the porta hepatis as well as additional portosystemic shunts. Multiple areas of stellate appearing enhancement likely reflecting fibrosis. Pancreas: Multiple coarse calcifications of the pancreas consistent with sequela of chronic pancreatitis. No peripancreatic fat stranding. Spleen: Splenomegaly. Multiple distended adjacent splenic collateral vessels. Adrenals/Urinary Tract: Adrenal glands are unremarkable. Multiple nonobstructing bilateral nephrolithiasis. No obstructing nephrolithiasis. No hydronephrosis. Evaluation of the ureterovesicular junction is limited due to streak artifact from hip arthroplasty. Bladder is unremarkable. Stomach/Bowel: No evidence of bowel obstruction. Moderate hiatal hernia. Prominence of the stomach folds. Appendix is normal. Vascular/Lymphatic: Abdominal aorta is normal in caliber. No visualized lymphadenopathy. Retroaortic LEFT renal vein. Reproductive: Prostate is obscured by streak artifact. Other: No free air or free fluid. Musculoskeletal: Status post bilateral hip arthroplasty. IMPRESSION: 1. Cirrhosis with sequela of portal hypertension including splenomegaly and multiple portosystemic shunts. 2. Prominence of the stomach folds. This could reflect gastritis in the appropriate clinical setting. 3. Moderate hiatal hernia. 4. Nonobstructing bilateral nephrolithiasis. 5. Sequela of chronic pancreatitis. Electronically Signed    By: Meda Klinefelter M.D.   On: 08/24/2022 20:08   DG Chest 2 View  Result Date: 08/24/2022 CLINICAL DATA:  upper GI bleed EXAM: CHEST - 2 VIEW COMPARISON:  None Available. FINDINGS: The cardiomediastinal silhouette is normal in contour. No pleural effusion. No pneumothorax. No acute pleuroparenchymal abnormality. Visualized abdomen is unremarkable. No acute osseous abnormality noted. IMPRESSION: No acute cardiopulmonary abnormality. Electronically Signed   By: Meda Klinefelter M.D.   On: 08/24/2022 19:24   US ABDOMEN LIMITED RUQ (LIVER/GB)  Result Date: 08/24/2022 CLINICAL DATA:  Pain EXAM: ULTRASOUND ABDOMEN LIMITED RIGHT UPPER QUADRANT COMPARISON:  None Available. FINDINGS: Gallbladder: No gallstones or wall thickening visualized. No sonographic Murphy sign noted by sonographer. Common bile duct: Not able to visualized to overlying bowel gas. Liver: Evaluation is limited to decreased penetration, Underlying mass can not be excluded given exam limitations. Nodular and coarse echotexture parenchymal echogenicity. Portal vein is not visualized due to overlying bowel bowel-gas. Other: None. IMPRESSION: 1. Markedly limited exam due to patient body habitus and overlying bowel gas. 2. Nodular and coarse echotexture of the liver parenchyma, suggestive of cirrhosis. Underlying mass can not be excluded given exam limitations. 3. Bile ducts and portal vein are not visualized due to exam limitations. 4. Visualized gallbladder is unremarkable. Electronically Signed   By: Aliene Altes.D.  On: 08/24/2022 19:01    Microbiology: Results for orders placed or performed during the hospital encounter of 09/04/22  Varicella-zoster by PCR     Status: None   Collection Time: 09/04/22  6:44 PM   Specimen: Blood  Result Value Ref Range Status   Varicella-Zoster, PCR Negative Negative Final    Comment: (NOTE) No Varicella Zoster Virus DNA detected. This test was developed and its performance  characteristics determined by [facility].  It has not been cleared or approved by the Food and Drug Administration.  The FDA has determined that such clearance or approval is not necessary. Performed At: [facility] [address] [place], [place] [id number] Jolene Schimke MD NW:[id number]     Labs: CBC: Recent Labs  Lab 09/12/22 0444  WBC 7.5  HGB 10.1*  HCT 30.7*  MCV 77.5*  PLT 236   Basic Metabolic Panel: Recent Labs  Lab 09/08/22 0401 09/09/22 0347 09/10/22 0415 09/11/22 0433 09/12/22 0444 09/13/22 0420  NA 135 139 134* 136 136 137  K 3.6 3.5 3.1* 3.4* 3.6 3.5  CL 109 107 106 110 109 107  CO2 18* 19* 18* 20* 18* 21*  GLUCOSE 98 88 84 85 86 75  BUN 12 17 18 18 18 17   CREATININE 0.50* 0.65 0.79 0.80 0.80 0.84  CALCIUM 8.9 9.3 8.7* 8.5* 8.6* 8.9  MG 2.0  --   --   --  2.2  --    Liver Function Tests: Recent Labs  Lab 09/09/22 0347 09/10/22 0415 09/11/22 0433 09/12/22 0444 09/13/22 0420  AST 188* 185* 175* 173* 187*  ALT 88* 89* 92* 101* 118*  ALKPHOS 116 115 113 115 122  BILITOT 29.9* 29.6* 28.2* 25.9* 25.4*  PROT 6.8 6.9 7.1 7.1 7.5  ALBUMIN 2.3* 2.4* 2.4* 2.5* 2.6*   CBG: No results for input(s): "GLUCAP" in the last 168 hours.  Discharge time spent: less than 30 minutes.  Signed: Pennie Banter, DO [facility] 09/13/2022

## 2022-09-13 NOTE — Plan of Care (Addendum)
Patient called the nurse in room and said he is Leaving AMA . Nurse notified Dr Esaw Grandchild A,. Received Discharge ordered. Home medications was return to patient. Discharge instructions given to patient and indicate understanding. PIV removed. Discharge home with self care.       Problem: Education: Goal: Knowledge of General Education information will improve Description: Including pain rating scale, medication(s)/side effects and non-pharmacologic comfort measures Outcome: Adequate for Discharge   Problem: Health Behavior/Discharge Planning: Goal: Ability to manage health-related needs will improve Outcome: Adequate for Discharge   Problem: Clinical Measurements: Goal: Ability to maintain clinical measurements within normal limits will improve Outcome: Adequate for Discharge Goal: Will remain free from infection Outcome: Adequate for Discharge Goal: Diagnostic test results will improve Outcome: Adequate for Discharge Goal: Respiratory complications will improve Outcome: Adequate for Discharge Goal: Cardiovascular complication will be avoided Outcome: Adequate for Discharge   Problem: Activity: Goal: Risk for activity intolerance will decrease Outcome: Adequate for Discharge   Problem: Nutrition: Goal: Adequate nutrition will be maintained Outcome: Adequate for Discharge   Problem: Coping: Goal: Level of anxiety will decrease Outcome: Adequate for Discharge   Problem: Elimination: Goal: Will not experience complications related to bowel motility Outcome: Adequate for Discharge Goal: Will not experience complications related to urinary retention Outcome: Adequate for Discharge   Problem: Pain Managment: Goal: General experience of comfort will improve Outcome: Adequate for Discharge   Problem: Safety: Goal: Ability to remain free from injury will improve Outcome: Adequate for Discharge   Problem: Skin Integrity: Goal: Risk for impaired skin integrity will  decrease Outcome: Adequate for Discharge

## 2022-09-19 ENCOUNTER — Telehealth: Payer: Self-pay

## 2022-09-19 DIAGNOSIS — K7030 Alcoholic cirrhosis of liver without ascites: Secondary | ICD-10-CM

## 2022-09-19 NOTE — Telephone Encounter (Signed)
Patient left a message at 11:45am and states that he saw Dr. Allegra Lai in the hospital for Cirrhosis and needs a hospital follow up appointment. Please advised when he needs to be seen or is it next available

## 2022-09-19 NOTE — Telephone Encounter (Signed)
Lets recheck his LFTs in next 1 to 2 days and I can see him in the office next week on Tuesday when I am on-call  RV

## 2022-09-20 NOTE — Telephone Encounter (Signed)
Patient return call per Virl Axe and I was on the phone. Return patient call and left  a message for call back

## 2022-09-20 NOTE — Addendum Note (Signed)
Addended by: Radene Knee L on: 09/20/2022 08:48 AM   Modules accepted: Orders

## 2022-09-20 NOTE — Telephone Encounter (Signed)
Patient called back and states he will come to our office to have labs done today when he gets off work

## 2022-09-20 NOTE — Telephone Encounter (Signed)
Per Dr. Allegra Lai she actually can not see patient next Tuesday. She said lets wait to see what blood work shows and then decide about appointment.  Called and left a message for call back

## 2022-09-21 NOTE — Telephone Encounter (Signed)
[facility] called in critical bilirubin lab for pt

## 2022-09-22 ENCOUNTER — Telehealth: Payer: Self-pay

## 2022-09-22 ENCOUNTER — Telehealth: Payer: Self-pay | Admitting: Gastroenterology

## 2022-09-22 NOTE — Telephone Encounter (Signed)
[facility] called in to speak to the nurse.

## 2022-09-22 NOTE — Telephone Encounter (Signed)
Called the number provider which is [phone] which is [facility] and reference number [id number]. They were calling about the Bilirubin Total result and Bilirubin Direct results. Informed [facility] the provider has already reviewed the results

## 2022-09-22 NOTE — Telephone Encounter (Signed)
Called and left a message for call back  

## 2022-09-22 NOTE — Telephone Encounter (Signed)
There are no rooms available for clinic on Tuesday afternoon in [place]. Made appointment for patient on Monday at 1

## 2022-09-22 NOTE — Telephone Encounter (Signed)
-----   Message from [facility] sent at 09/21/2022  5:12 PM EDT ----- Please inform patient that his bilirubin is improving, but his liver enzymes are slightly worse.  I would like to see him next week Tuesday afternoon.  He should not be drinking any alcohol.  RV

## 2022-09-26 ENCOUNTER — Encounter: Payer: Self-pay | Admitting: Gastroenterology

## 2022-09-26 ENCOUNTER — Other Ambulatory Visit: Payer: Self-pay

## 2022-09-26 ENCOUNTER — Ambulatory Visit (INDEPENDENT_AMBULATORY_CARE_PROVIDER_SITE_OTHER): Payer: Medicaid Other | Admitting: Gastroenterology

## 2022-09-26 VITALS — BP 110/71 | HR 82 | Temp 97.1°F | Ht 67.0 in | Wt 191.1 lb

## 2022-09-26 DIAGNOSIS — M549 Dorsalgia, unspecified: Secondary | ICD-10-CM | POA: Diagnosis not present

## 2022-09-26 DIAGNOSIS — K7031 Alcoholic cirrhosis of liver with ascites: Secondary | ICD-10-CM | POA: Diagnosis not present

## 2022-09-26 DIAGNOSIS — F10239 Alcohol dependence with withdrawal, unspecified: Secondary | ICD-10-CM

## 2022-09-26 DIAGNOSIS — D509 Iron deficiency anemia, unspecified: Secondary | ICD-10-CM | POA: Diagnosis not present

## 2022-09-26 DIAGNOSIS — K7030 Alcoholic cirrhosis of liver without ascites: Secondary | ICD-10-CM

## 2022-09-26 DIAGNOSIS — Z1211 Encounter for screening for malignant neoplasm of colon: Secondary | ICD-10-CM

## 2022-09-26 DIAGNOSIS — K7010 Alcoholic hepatitis without ascites: Secondary | ICD-10-CM

## 2022-09-26 MED ORDER — PEG 3350-KCL-NA BICARB-NACL 420 G PO SOLR: 4000.0000 mL | Freq: Once | ORAL | 0 refills | Status: AC

## 2022-09-26 MED ORDER — TRAMADOL HCL 50 MG PO TABS
50.0000 mg | ORAL_TABLET | Freq: Four times a day (QID) | ORAL | 0 refills | Status: AC | PRN
Start: 2022-09-26 — End: 2022-10-04

## 2022-09-26 NOTE — Patient Instructions (Addendum)
Cut down the Prednisone to 1.5 tablet daily.  Gave Fusion Plus samples take 1 tablet every other day please let us know how it works and we can call you in a prescription for you.

## 2022-09-26 NOTE — Progress Notes (Signed)
Stephen Repress, MD [address]  [address]  [place], [place] [address]  Main: [phone]  Fax: [phone]    Gastroenterology Consultation  Referring Provider:     No ref. provider found Primary Care Physician:  Pcp, No Primary Gastroenterologist:  Dr. Arlyss Mayer Reason for Consultation: Elevated LFTs        HPI:   Stephen Mayer is a 47 y.o. male referred by  Pcp, No  for consultation & management of elevated LFTs.  Patient is admitted to [facility] end of July 2024 secondary to significantly elevated LFTs thought to be secondary to acute alcoholic hepatitis, discriminant function greater than 35 discharged home on prednisone 40 mg daily for 4 weeks.  Patient also underwent workup to rule out other causes, viral etiology was negative, antimitochondrial antibodies, smooth muscle antibodies and ANA were negative.  Serum ferritin was 37.  Since discharge, patient reports that he has been having sudden onset of intermittent severe right-sided mid back pain radiating to the front of his chest, usually occurs during the day and relieves when he lays down.  Denies any associated symptoms of nausea, vomiting or relation to food.  He has been taking ibuprofen 600 mg 4 pills daily because he ran out of oxycodone that was prescribed at the time of discharge from the hospital.  He does state that oxycodone was helping his pain and he would take every other day.  Most recent LFTs since he was discharged showed drop in bilirubin to 19 from peak of 31, transaminases, alkaline phosphatase were elevated.  Patient denies drinking alcohol.  He started going back to work.  He is trying to eat healthy.  He has been drinking fruit juices along with water  NSAIDs: Ibuprofen 600 mg 3 to 4 pills daily for back pain  Antiplts/Anticoagulants/Anti thrombotics: None  GI Procedures:  Upper endoscopy 08/27/2022 Possible grade 1 varices, small hiatal hernia, portal hypertensive gastropathy, normal  duodenum   Past Medical History:  Diagnosis Date   Arthritis     Past Surgical History:  Procedure Laterality Date   ESOPHAGOGASTRODUODENOSCOPY (EGD) WITH PROPOFOL N/A 08/27/2022   Procedure: ESOPHAGOGASTRODUODENOSCOPY (EGD) WITH PROPOFOL;  Surgeon: Regis Bill, MD;  Location: [facility] ENDOSCOPY;  Service: Endoscopy;  Laterality: N/A;     Current Outpatient Medications:    folic acid (FOLVITE) 1 MG tablet, Take 1 tablet (1 mg total) by mouth daily., Disp: 30 tablet, Rfl: 2   hydrOXYzine (ATARAX) 25 MG tablet, Take 1 tablet (25 mg total) by mouth 3 (three) times daily as needed (itching or sleep)., Disp: 30 tablet, Rfl: 2   ibuprofen (ADVIL) 400 MG tablet, Take 1 tablet (400 mg total) by mouth every 6 (six) hours as needed for moderate pain., Disp: , Rfl:    Multiple Vitamin (MULTIVITAMIN WITH MINERALS) TABS tablet, Take 1 tablet by mouth daily., Disp: , Rfl:    Omeprazole Magnesium (PRILOSEC OTC PO), Take 20 mg by mouth daily., Disp: , Rfl:    polyethylene glycol-electrolytes (NULYTELY) 420 g solution, Take 4,000 mLs by mouth once for 1 dose., Disp: 4000 mL, Rfl: 0   predniSONE (DELTASONE) 20 MG tablet, Take 2 tablets (40 mg total) by mouth daily with breakfast for 28 days., Disp: 56 tablet, Rfl: 0   thiamine (VITAMIN B-1) 100 MG tablet, Take 1 tablet (100 mg total) by mouth daily., Disp: 30 tablet, Rfl: 2   History reviewed. No pertinent family history.   Social History   Tobacco Use  Smoking status: Every Day    Types: Cigarettes   Smokeless tobacco: Never  Vaping Use   Vaping status: Never Used  Substance Use Topics   Alcohol use: Not Currently    Comment: States stopped on 08/08/2022   Drug use: Yes    Allergies as of 09/26/2022   (No Known Allergies)    Review of Systems:    All systems reviewed and negative except where noted in HPI.   Physical Exam:  BP 110/71 (BP Location: Left Arm, Patient Position: Sitting, Cuff Size: Normal)   Pulse 82   Temp (!)  97.1 F (36.2 C) (Oral)   Ht 5\' 7"  (1.702 m)   Wt 191 lb 2 oz (86.7 kg)   BMI 29.93 kg/m  No LMP for male patient.  General:   Alert,  Well-developed, well-nourished, pleasant and cooperative in NAD Head:  Normocephalic and atraumatic. Eyes:  Sclera clear, no icterus.   Conjunctiva pink. Ears:  Normal auditory acuity. Nose:  No deformity, discharge, or lesions. Mouth:  No deformity or lesions,oropharynx pink & moist. Neck:  Supple; no masses or thyromegaly. Lungs:  Respirations even and unlabored.  Clear throughout to auscultation.   No wheezes, crackles, or rhonchi. No acute distress. Heart:  Regular rate and rhythm; no murmurs, clicks, rubs, or gallops. Abdomen:  Normal bowel sounds. Soft, non-tender and non-distended without masses, hepatosplenomegaly or hernias noted.  No guarding or rebound tenderness.   Rectal: Not performed Msk:  Symmetrical without gross deformities. Good, equal movement & strength bilaterally. Pulses:  Normal pulses noted. Extremities:  No clubbing or edema.  No cyanosis. Neurologic:  Alert and oriented x3;  grossly normal neurologically. Skin:  Intact without significant lesions or rashes. No jaundice. Psych:  Alert and cooperative. Normal mood and affect.  Imaging Studies: Reviewed  Assessment and Plan:   [name] is a 47 y.o. male with history of alcoholic cirrhosis of liver, acute alcoholic hepatitis, currently on prednisone  Alcoholic cirrhosis of liver with acute alcoholic hepatitis After ruling out infection and secondary causes of significantly elevated LFTs, patient is started on prednisone based on discriminant score greater than 35, currently on 40 mg daily for last 3 weeks, decrease the dose by 10 mg weekly Patient admits to taking large doses of ibuprofen for his back pain Strongly advised him to avoid ibuprofen.  He is requesting oxycodone which I refused and instead prescribed him 8 days of tramadol, 30 pills only Recheck LFTs  today Check ceruloplasmin, alpha-1 antitrypsin levels Check CBC, BMP, PT/INR, iron panel EGD revealed small varices, no evidence of thrombocytopenia or coagulopathy or volume overload Reiterated on low-sodium diet Advised patient to continue to remain abstinent from alcohol use  Colon cancer screening Schedule screening colonoscopy in 1 to 2 months  Iron deficiency anemia Recheck iron panel Trial of fusion plus, samples provided   Follow up in 4 weeks, okay to overbook   Stephen Repress, MD

## 2022-09-27 ENCOUNTER — Telehealth: Payer: Self-pay

## 2022-09-27 NOTE — Telephone Encounter (Signed)
Called and left a message for call back  

## 2022-09-27 NOTE — Telephone Encounter (Signed)
-----   Message from [facility] sent at 09/27/2022  2:04 PM EDT ----- Please inform patient that his bilirubin continues to improve.  His liver enzymes are still elevated but slowly improving.  Continue prednisone 30 mg daily for 1 week, followed by taper by 10 mg until finished.  He does not have iron deficiency, he can finish the samples we gave him and he will no longer need iron supplements after that  Recheck LFTs in 2 weeks  RV

## 2022-09-28 NOTE — Telephone Encounter (Signed)
Patient verbalized understanding of results. He states he has enough prednisone to do the taper but will let us know if he runs out. He will finish the samples but not take any after that. He will come back for labs in 2 week will call to remind him also

## 2022-09-28 NOTE — Telephone Encounter (Signed)
Called and left a message for call back  

## 2022-10-10 ENCOUNTER — Telehealth: Payer: Self-pay

## 2022-10-10 DIAGNOSIS — K7030 Alcoholic cirrhosis of liver without ascites: Secondary | ICD-10-CM

## 2022-10-10 NOTE — Telephone Encounter (Signed)
-----   Message from [facility] sent at 09/28/2022 10:29 AM EDT ----- Recheck LFTs in 2 weeks

## 2022-10-10 NOTE — Telephone Encounter (Signed)
Order lab work and called and left a message for call back  

## 2022-10-11 NOTE — Telephone Encounter (Signed)
Called and left a message for call back  

## 2022-10-12 ENCOUNTER — Telehealth: Payer: Self-pay

## 2022-10-12 LAB — HEPATIC FUNCTION PANEL
ALT: 52 IU/L — ABNORMAL HIGH (ref 0–44)
AST: 93 IU/L — ABNORMAL HIGH (ref 0–40)
Albumin: 2.8 g/dL — ABNORMAL LOW (ref 4.1–5.1)
Alkaline Phosphatase: 296 IU/L — ABNORMAL HIGH (ref 44–121)
Bilirubin Total: 5.2 mg/dL — ABNORMAL HIGH (ref 0.0–1.2)
Bilirubin, Direct: 3.28 mg/dL — ABNORMAL HIGH (ref 0.00–0.40)
Total Protein: 7.7 g/dL (ref 6.0–8.5)

## 2022-10-12 NOTE — Telephone Encounter (Signed)
Patient verbalized understanding of results  

## 2022-10-12 NOTE — Telephone Encounter (Signed)
Called and left a message for call back. Unable to reach patient please advise

## 2022-10-12 NOTE — Telephone Encounter (Signed)
-----   Message from [facility] sent at 10/12/2022 11:00 AM EDT ----- LFTs continue to improve, his jaundice numbers, bilirubin is significantly down, tell him to keep up the good work  Will see him as scheduled  RV

## 2022-10-12 NOTE — Telephone Encounter (Signed)
Called and left a message for call back  

## 2022-10-27 ENCOUNTER — Ambulatory Visit: Payer: Medicaid Other | Admitting: Gastroenterology

## 2022-10-31 ENCOUNTER — Other Ambulatory Visit: Payer: Self-pay

## 2022-10-31 ENCOUNTER — Emergency Department: Payer: Medicaid Other

## 2022-10-31 ENCOUNTER — Encounter: Payer: Self-pay | Admitting: *Deleted

## 2022-10-31 ENCOUNTER — Emergency Department
Admission: EM | Admit: 2022-10-31 | Discharge: 2022-10-31 | Disposition: A | Discharge status: Home / Self Care | Payer: Medicaid Other | Attending: Emergency Medicine | Admitting: Emergency Medicine

## 2022-10-31 DIAGNOSIS — W1839XA Other fall on same level, initial encounter: Secondary | ICD-10-CM | POA: Diagnosis not present

## 2022-10-31 DIAGNOSIS — M25552 Pain in left hip: Secondary | ICD-10-CM | POA: Insufficient documentation

## 2022-10-31 LAB — URINALYSIS, ROUTINE W REFLEX MICROSCOPIC
Bilirubin Urine: NEGATIVE
Glucose, UA: NEGATIVE mg/dL
Hgb urine dipstick: NEGATIVE
Ketones, ur: NEGATIVE mg/dL
Nitrite: NEGATIVE
Protein, ur: NEGATIVE mg/dL
Specific Gravity, Urine: 1.011 (ref 1.005–1.030)
WBC, UA: >50 WBC/hpf (ref 0–5)
pH: 7 (ref 5.0–8.0)

## 2022-10-31 MED ORDER — PREDNISONE 50 MG PO TABS: ORAL_TABLET | ORAL | 0 refills | Status: DC

## 2022-10-31 MED ORDER — MELOXICAM 15 MG PO TABS: 15.0000 mg | ORAL_TABLET | Freq: Every day | ORAL | 0 refills | Status: AC

## 2022-10-31 MED ORDER — OXYCODONE HCL 5 MG PO TABS
5.0000 mg | ORAL_TABLET | Freq: Three times a day (TID) | ORAL | 0 refills | Status: DC | PRN
Start: 2022-10-31 — End: 2023-01-13

## 2022-10-31 NOTE — ED Provider Notes (Signed)
[facility] Provider Note    Event Date/Time   First MD Initiated Contact with Patient 10/31/22 1718     (approximate)   History   Hip Pain   HPI  Stephen Mayer is a 47 y.o. male with PMH of EtOH abuse, cirrhosis, osteoporosis status post bilateral hip and bilateral knee replacements presents for evaluation of ongoing left-sided hip pain after a fall 10 days ago.  Patient has been able to walk since his fall, but he does walk with a limp.  He is taken Advil consistently and tried taking some leftover steroids but has still had significant pain.     Physical Exam   Triage Vital Signs: ED Triage Vitals  Encounter Vitals Group     BP 10/31/22 1541 134/87     Systolic BP Percentile --      Diastolic BP Percentile --      Pulse Rate 10/31/22 1541 (!) 105     Resp 10/31/22 1541 17     Temp 10/31/22 1541 98.6 F (37 C)     Temp Source 10/31/22 1541 Oral     SpO2 10/31/22 1541 99 %     Weight 10/31/22 1723 191 lb 2.2 oz (86.7 kg)     Height 10/31/22 1723 5\' 7"  (1.702 m)     Head Circumference --      Peak Flow --      Pain Score 10/31/22 1541 10     Pain Loc --      Pain Education --      Exclude from Growth Chart --     Most recent vital signs: Vitals:   10/31/22 1541  BP: 134/87  Pulse: (!) 105  Resp: 17  Temp: 98.6 F (37 C)  SpO2: 99%     General: Awake, no distress.  CV:  Good peripheral perfusion.  RRR. Resp:  Normal effort.  CTAB. Abd:  No distention.  Other:  Tender to palpation over the left hip.  5/5 strength in bilateral lower extremities.  Patient unable to perform ROM due to pain.   ED Results / Procedures / Treatments   Labs (all labs ordered are listed, but only abnormal results are displayed) Labs Reviewed  URINALYSIS, ROUTINE W REFLEX MICROSCOPIC - Abnormal; Notable for the following components:      Result Value   Color, Urine AMBER (*)    APPearance CLEAR (*)    Leukocytes,Ua MODERATE (*)    Bacteria, UA  RARE (*)    All other components within normal limits    RADIOLOGY  Left hip x-rays obtained, interpreted the images as well as reviewed the radiologist report.  X-rays are negative for fracture and dislocation.  Patient is status post total hip arthroplasty bilaterally.   PROCEDURES:  Critical Care performed: No  Procedures   MEDICATIONS ORDERED IN ED: Medications - No data to display   IMPRESSION / MDM / ASSESSMENT AND PLAN / ED COURSE  I reviewed the triage vital signs and the nursing notes.                             47 year old male presents for evaluation of left hip pain after a fall.  Vital signs stable in triage aside from mildly elevated heart rate.  Patient is in no acute distress on exam.  Differential diagnosis includes, but is not limited to, contusion, fracture, dislocation, muscle strain.  Patient's presentation is most consistent  with acute complicated illness / injury requiring diagnostic workup.  Left hip x-ray obtained to evaluate for underlying fracture and to assess patient's prosthesis, x-rays were negative for fracture and dislocation.  I believe patient's pain is secondary to ongoing inflammation after his fall.  He is unable to take Tylenol due to his history of cirrhosis, so I will prescribe him a few days of oxycodone.  I am also going to send meloxicam and have him try steroid burst.  Patient is to follow up with orthopedics.  He voiced understanding, all questions were answered and he was stable at discharge.    FINAL CLINICAL IMPRESSION(S) / ED DIAGNOSES   Final diagnoses:  Left hip pain     Rx / DC Orders   ED Discharge Orders          Ordered    Ambulatory Referral to Primary Care (Establish Care)        10/31/22 1749    oxyCODONE (ROXICODONE) 5 MG immediate release tablet  Every 8 hours PRN        10/31/22 1751    meloxicam (MOBIC) 15 MG tablet  Daily        10/31/22 1751    predniSONE (DELTASONE) 50 MG tablet        10/31/22  1751             Note:  This document was prepared using Dragon voice recognition software and may include unintentional dictation errors.   Cameron Ali, PA-C 10/31/22 1752    Minna Antis, MD 10/31/22 2248
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
## 2022-10-31 NOTE — Discharge Instructions (Signed)
You can take the oxycodone every 8 hours as needed for severe pain.  Please take the meloxicam once a day for 14 days.  While you are taking this medication do not take any other NSAIDs like ibuprofen, naproxen, Advil or Aleve.  Please take the prednisone once a day for 5 days.  I have placed a referral for primary care, please keep an eye out for a phone call regarding this.  Please schedule follow-up with Dr. Allena Katz who is an orthopedic doctor.  His information is attached.  You will need to call the office.

## 2022-11-11 ENCOUNTER — Emergency Department
Admission: EM | Admit: 2022-11-11 | Discharge: 2022-11-12 | Disposition: A | Discharge status: Home / Self Care | Payer: Medicaid Other | Attending: Emergency Medicine | Admitting: Emergency Medicine

## 2022-11-11 ENCOUNTER — Other Ambulatory Visit: Payer: Self-pay

## 2022-11-11 DIAGNOSIS — M79672 Pain in left foot: Secondary | ICD-10-CM | POA: Insufficient documentation

## 2022-11-11 DIAGNOSIS — M25512 Pain in left shoulder: Secondary | ICD-10-CM | POA: Diagnosis present

## 2022-11-11 DIAGNOSIS — M7918 Myalgia, other site: Secondary | ICD-10-CM

## 2022-11-11 DIAGNOSIS — M791 Myalgia, unspecified site: Secondary | ICD-10-CM | POA: Insufficient documentation
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
## 2022-11-12 NOTE — Discharge Instructions (Signed)

## 2022-11-26 ENCOUNTER — Emergency Department
Admission: EM | Admit: 2022-11-26 | Discharge: 2022-11-26 | Disposition: A | Discharge status: Home / Self Care | Payer: Medicaid Other | Attending: Student in an Organized Health Care Education/Training Program | Admitting: Student in an Organized Health Care Education/Training Program

## 2022-11-26 ENCOUNTER — Other Ambulatory Visit: Payer: Self-pay

## 2022-11-26 ENCOUNTER — Emergency Department: Payer: Medicaid Other

## 2022-11-26 DIAGNOSIS — R1013 Epigastric pain: Secondary | ICD-10-CM | POA: Insufficient documentation

## 2022-11-26 LAB — COMPREHENSIVE METABOLIC PANEL
ALT: 34 U/L (ref 0–44)
AST: 102 U/L — ABNORMAL HIGH (ref 15–41)
Albumin: 2.6 g/dL — ABNORMAL LOW (ref 3.5–5.0)
Alkaline Phosphatase: 291 U/L — ABNORMAL HIGH (ref 38–126)
Anion gap: 14 (ref 5–15)
BUN: 12 mg/dL (ref 6–20)
CO2: 26 mmol/L (ref 22–32)
Calcium: 11.1 mg/dL — ABNORMAL HIGH (ref 8.9–10.3)
Chloride: 94 mmol/L — ABNORMAL LOW (ref 98–111)
Creatinine, Ser: 1.08 mg/dL (ref 0.61–1.24)
GFR, Estimated: >60 mL/min (ref >60)
Glucose, Bld: 132 mg/dL — ABNORMAL HIGH (ref 70–99)
Potassium: 2.8 mmol/L — ABNORMAL LOW (ref 3.5–5.1)
Sodium: 134 mmol/L — ABNORMAL LOW (ref 135–145)
Total Bilirubin: 5.8 mg/dL — ABNORMAL HIGH (ref 0.3–1.2)
Total Protein: 9 g/dL — ABNORMAL HIGH (ref 6.5–8.1)

## 2022-11-26 LAB — AMMONIA: Ammonia: 49 umol/L — ABNORMAL HIGH (ref 9–35)

## 2022-11-26 LAB — CBC
HCT: 44.5 % (ref 39.0–52.0)
Hemoglobin: 14.4 g/dL (ref 13.0–17.0)
MCH: 28.7 pg (ref 26.0–34.0)
MCHC: 32.4 g/dL (ref 30.0–36.0)
MCV: 88.8 fL (ref 80.0–100.0)
Platelets: 206 10*3/uL (ref 150–400)
RBC: 5.01 MIL/uL (ref 4.22–5.81)
RDW: 17.3 % — ABNORMAL HIGH (ref 11.5–15.5)
WBC: 10.1 10*3/uL (ref 4.0–10.5)
nRBC: 0 % (ref 0.0–0.2)

## 2022-11-26 LAB — LIPASE, BLOOD: Lipase: 147 U/L — ABNORMAL HIGH (ref 11–51)

## 2022-11-26 MED ORDER — PANTOPRAZOLE SODIUM 40 MG IV SOLR
40.0000 mg | Freq: Once | INTRAVENOUS | Status: AC
  Administered 2022-11-26: 40 mg via INTRAVENOUS
  Filled 2022-11-26: qty 10

## 2022-11-26 MED ORDER — LACTULOSE 10 GM/15ML PO SOLN: 10.0000 g | Freq: Two times a day (BID) | ORAL | 0 refills | Status: DC | PRN

## 2022-11-26 MED ORDER — PANTOPRAZOLE SODIUM 40 MG PO TBEC: 40.0000 mg | DELAYED_RELEASE_TABLET | Freq: Every day | ORAL | 1 refills | Status: DC

## 2022-11-26 MED ORDER — ALUM & MAG HYDROXIDE-SIMETH 200-200-20 MG/5ML PO SUSP
30.0000 mL | Freq: Once | ORAL | Status: AC
  Administered 2022-11-26: 30 mL via ORAL
  Filled 2022-11-26: qty 30

## 2022-11-26 MED ORDER — IOHEXOL 300 MG/ML  SOLN
100.0000 mL | Freq: Once | INTRAMUSCULAR | Status: AC | PRN
  Administered 2022-11-26: 75 mL via INTRAVENOUS
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
## 2022-11-26 NOTE — ED Provider Notes (Signed)
[facility] Provider Note    Event Date/Time   First MD Initiated Contact with Patient 11/26/22 [phone]     (approximate)   History   Abdominal Pain   HPI  Stephen Mayer is a 47 y.o. male with a history of alcoholic cirrhosis presents to the ER for evaluation of abdominal pain in epigastric region nausea vomiting for the past 2 to 3 days with no bowel movements or passing gas.  Denies any fevers or chills.     Physical Exam   Triage Vital Signs: ED Triage Vitals  Encounter Vitals Group     BP 11/26/22 0834 (!) 142/97     Systolic BP Percentile --      Diastolic BP Percentile --      Pulse Rate 11/26/22 0834 (!) 124     Resp 11/26/22 0834 20     Temp 11/26/22 0834 98 F (36.7 C)     Temp Source 11/26/22 0834 Oral     SpO2 11/26/22 0834 96 %     Weight --      Height --      Head Circumference --      Peak Flow --      Pain Score 11/26/22 0830 10     Pain Loc --      Pain Education --      Exclude from Growth Chart --     Most recent vital signs: Vitals:   11/26/22 0834 11/26/22 1105  BP: (!) 142/97 (!) 141/86  Pulse: (!) 124 (!) 104  Resp: 20 18  Temp: 98 F (36.7 C)   SpO2: 96% 99%     Constitutional: Alert  Eyes: Conjunctivae are normal.  Head: Atraumatic. Nose: No congestion/rhinnorhea. Mouth/Throat: Mucous membranes are moist.   Neck: Painless ROM.  Cardiovascular:   Good peripheral circulation. Respiratory: Normal respiratory effort.  No retractions.  Gastrointestinal: Soft mild epigastric tenderness to palpation no lower abdominal tenderness palpation. Musculoskeletal:  no deformity Neurologic:  MAE spontaneously. No gross focal neurologic deficits are appreciated.  Skin:  Skin is warm, dry and intact. No rash noted. Psychiatric: Mood and affect are normal. Speech and behavior are normal.    ED Results / Procedures / Treatments   Labs (all labs ordered are listed, but only abnormal results are displayed) Labs  Reviewed  CBC - Abnormal; Notable for the following components:      Result Value   RDW 17.3 (*)    All other components within normal limits  AMMONIA - Abnormal; Notable for the following components:   Ammonia 49 (*)    All other components within normal limits  COMPREHENSIVE METABOLIC PANEL - Abnormal; Notable for the following components:   Sodium 134 (*)    Potassium 2.8 (*)    Chloride 94 (*)    Glucose, Bld 132 (*)    Calcium 11.1 (*)    Total Protein 9.0 (*)    Albumin 2.6 (*)    AST 102 (*)    Alkaline Phosphatase 291 (*)    Total Bilirubin 5.8 (*)    All other components within normal limits  LIPASE, BLOOD - Abnormal; Notable for the following components:   Lipase 147 (*)    All other components within normal limits  URINALYSIS, ROUTINE W REFLEX MICROSCOPIC        RADIOLOGY Please see ED Course for my review and interpretation.  I personally reviewed all radiographic images ordered to evaluate for the above acute complaints  and reviewed radiology reports and findings.  These findings were personally discussed with the patient.  Please see medical record for radiology report.    PROCEDURES:  Critical Care performed: No  Procedures   MEDICATIONS ORDERED IN ED: Medications  pantoprazole (PROTONIX) injection 40 mg (40 mg Intravenous Given 11/26/22 0915)  iohexol (OMNIPAQUE) 300 MG/ML solution 100 mL (75 mLs Intravenous Contrast Given 11/26/22 1048)  alum & mag hydroxide-simeth (MAALOX/MYLANTA) 200-200-20 MG/5ML suspension 30 mL (30 mLs Oral Given 11/26/22 1120)     IMPRESSION / MDM / ASSESSMENT AND PLAN / ED COURSE  I reviewed the triage vital signs and the nursing notes.                              Differential diagnosis includes, but is not limited to, obstruction, gastritis, enteritis, SBO, colitis, pancreatitis  Patient presenting to the ER for evaluation of symptoms as described above.  Based on symptoms, risk factors and considered above  differential, this presenting complaint could reflect a potentially life-threatening illness therefore the patient will be placed on continuous pulse oximetry and telemetry for monitoring.  Laboratory evaluation will be sent to evaluate for the above complaints.      Clinical Course as of 11/26/22 1159  Sat Nov 26, 2022  1114 CT imaging on my review and interpretation without evidence of significant obstruction or ascites. [PR]  1156 CT imaging the patient is reassuring and stable.  I do suspect his presentation is secondary to acute on chronic presentation of his cirrhosis probably underlying gastritis as well.  No signs or symptoms of GI bleed he is hemodynamically stable and appropriate for outpatient follow-up. [PR]    Clinical Course User Index [PR] Willy Eddy, MD     FINAL CLINICAL IMPRESSION(S) / ED DIAGNOSES   Final diagnoses:  Epigastric pain     Rx / DC Orders   ED Discharge Orders          Ordered    pantoprazole (PROTONIX) 40 MG tablet  Daily        11/26/22 1157    lactulose (CHRONULAC) 10 GM/15ML solution  2 times daily PRN        11/26/22 1158             Note:  This document was prepared using Dragon voice recognition software and may include unintentional dictation errors.    Willy Eddy, MD 11/26/22 1159

## 2022-11-28 ENCOUNTER — Ambulatory Visit: Admission: RE | Admit: 2022-11-28 | Payer: Medicaid Other | Source: Ambulatory Visit | Admitting: Gastroenterology

## 2022-11-28 ENCOUNTER — Encounter: Payer: Self-pay | Admitting: Anesthesiology

## 2022-11-28 SURGERY — COLONOSCOPY WITH PROPOFOL
Anesthesia: General

## 2022-11-28 NOTE — Anesthesia Preprocedure Evaluation (Addendum)
Anesthesia Evaluation    Airway        Dental   Pulmonary Current Smoker          Cardiovascular      Neuro/Psych Alcohol use disorder    GI/Hepatic ,,,(+) Cirrhosis         Endo/Other    Renal/GU      Musculoskeletal  (+) Arthritis ,    Abdominal   Peds  Hematology  (+) Blood dyscrasia, anemia   Anesthesia Other Findings   Reproductive/Obstetrics                             Anesthesia Physical Anesthesia Plan  ASA: 3  Anesthesia Plan: General   Post-op Pain Management:    Induction:   PONV Risk Score and Plan:   Airway Management Planned:   Additional Equipment:   Intra-op Plan:   Post-operative Plan:   Informed Consent:   Plan Discussed with:   Anesthesia Plan Comments: (Case canceled on 11/28/22)        Anesthesia Quick Evaluation

## 2022-12-07 ENCOUNTER — Other Ambulatory Visit: Payer: Self-pay | Admitting: Physician Assistant

## 2022-12-07 DIAGNOSIS — M25552 Pain in left hip: Secondary | ICD-10-CM

## 2022-12-12 ENCOUNTER — Ambulatory Visit
Admission: RE | Admit: 2022-12-12 | Discharge: 2022-12-12 | Disposition: A | Discharge status: Home / Self Care | Payer: Medicaid Other | Source: Ambulatory Visit | Attending: Physician Assistant | Admitting: Physician Assistant

## 2022-12-12 DIAGNOSIS — M25552 Pain in left hip: Secondary | ICD-10-CM | POA: Diagnosis present

## 2022-12-13 ENCOUNTER — Other Ambulatory Visit: Payer: Self-pay

## 2022-12-21 ENCOUNTER — Ambulatory Visit: Payer: Medicaid Other | Admitting: Gastroenterology

## 2023-01-05 ENCOUNTER — Telehealth: Payer: Self-pay

## 2023-01-05 NOTE — Telephone Encounter (Signed)
Received a signed medical records request from [facility] from 11/02/2022 to current. Printed last office visit note, labs and faxed to [phone]. Got confirmation fax went through

## 2023-01-06 ENCOUNTER — Ambulatory Visit: Payer: Medicaid Other | Admitting: Physician Assistant

## 2023-01-06 ENCOUNTER — Encounter: Payer: Self-pay | Admitting: Physician Assistant

## 2023-01-06 VITALS — BP 132/84 | HR 113 | Temp 98.5°F | Resp 16 | Ht 67.0 in | Wt 190.0 lb

## 2023-01-06 DIAGNOSIS — K219 Gastro-esophageal reflux disease without esophagitis: Secondary | ICD-10-CM | POA: Diagnosis not present

## 2023-01-06 DIAGNOSIS — Z96643 Presence of artificial hip joint, bilateral: Secondary | ICD-10-CM | POA: Diagnosis not present

## 2023-01-06 DIAGNOSIS — N3000 Acute cystitis without hematuria: Secondary | ICD-10-CM

## 2023-01-06 DIAGNOSIS — M25552 Pain in left hip: Secondary | ICD-10-CM | POA: Diagnosis not present

## 2023-01-06 MED ORDER — OMEPRAZOLE 40 MG PO CPDR: 40.0000 mg | DELAYED_RELEASE_CAPSULE | Freq: Every day | ORAL | 1 refills | Status: DC

## 2023-01-06 MED ORDER — OMEPRAZOLE 20 MG PO CPDR
20.0000 mg | DELAYED_RELEASE_CAPSULE | Freq: Every day | ORAL | 3 refills | Status: DC
Start: 2023-01-06 — End: 2023-01-13
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
## 2023-01-10 ENCOUNTER — Other Ambulatory Visit: Payer: Self-pay | Admitting: Orthopedic Surgery

## 2023-01-10 DIAGNOSIS — Z96642 Presence of left artificial hip joint: Secondary | ICD-10-CM

## 2023-01-11 ENCOUNTER — Encounter: Payer: Self-pay | Admitting: Emergency Medicine

## 2023-01-11 ENCOUNTER — Inpatient Hospital Stay
Admission: EM | Admit: 2023-01-11 | Discharge: 2023-01-13 | DRG: 392 | Disposition: A | Discharge status: Home / Self Care | Payer: Medicaid Other | Attending: Internal Medicine | Admitting: Internal Medicine

## 2023-01-11 ENCOUNTER — Other Ambulatory Visit: Payer: Self-pay

## 2023-01-11 ENCOUNTER — Emergency Department: Payer: Medicaid Other

## 2023-01-11 DIAGNOSIS — K7030 Alcoholic cirrhosis of liver without ascites: Secondary | ICD-10-CM | POA: Diagnosis present

## 2023-01-11 DIAGNOSIS — Z888 Allergy status to other drugs, medicaments and biological substances status: Secondary | ICD-10-CM

## 2023-01-11 DIAGNOSIS — Z833 Family history of diabetes mellitus: Secondary | ICD-10-CM | POA: Diagnosis not present

## 2023-01-11 DIAGNOSIS — Z79899 Other long term (current) drug therapy: Secondary | ICD-10-CM

## 2023-01-11 DIAGNOSIS — D649 Anemia, unspecified: Secondary | ICD-10-CM | POA: Insufficient documentation

## 2023-01-11 DIAGNOSIS — F17210 Nicotine dependence, cigarettes, uncomplicated: Secondary | ICD-10-CM | POA: Diagnosis present

## 2023-01-11 DIAGNOSIS — K861 Other chronic pancreatitis: Secondary | ICD-10-CM | POA: Diagnosis present

## 2023-01-11 DIAGNOSIS — Z96653 Presence of artificial knee joint, bilateral: Secondary | ICD-10-CM | POA: Diagnosis present

## 2023-01-11 DIAGNOSIS — N189 Chronic kidney disease, unspecified: Secondary | ICD-10-CM | POA: Diagnosis present

## 2023-01-11 DIAGNOSIS — K852 Alcohol induced acute pancreatitis without necrosis or infection: Principal | ICD-10-CM

## 2023-01-11 DIAGNOSIS — Z720 Tobacco use: Secondary | ICD-10-CM

## 2023-01-11 DIAGNOSIS — D631 Anemia in chronic kidney disease: Secondary | ICD-10-CM | POA: Diagnosis present

## 2023-01-11 DIAGNOSIS — Z96643 Presence of artificial hip joint, bilateral: Secondary | ICD-10-CM | POA: Diagnosis present

## 2023-01-11 DIAGNOSIS — K859 Acute pancreatitis without necrosis or infection, unspecified: Secondary | ICD-10-CM | POA: Diagnosis present

## 2023-01-11 DIAGNOSIS — F1010 Alcohol abuse, uncomplicated: Secondary | ICD-10-CM | POA: Diagnosis present

## 2023-01-11 DIAGNOSIS — Z841 Family history of disorders of kidney and ureter: Secondary | ICD-10-CM | POA: Diagnosis not present

## 2023-01-11 DIAGNOSIS — K219 Gastro-esophageal reflux disease without esophagitis: Principal | ICD-10-CM | POA: Diagnosis present

## 2023-01-11 LAB — MAGNESIUM: Magnesium: 1.7 mg/dL (ref 1.7–2.4)

## 2023-01-11 LAB — PROTIME-INR
INR: 1.3 — ABNORMAL HIGH (ref 0.8–1.2)
Prothrombin Time: 16.2 s — ABNORMAL HIGH (ref 11.4–15.2)

## 2023-01-11 LAB — URINALYSIS, ROUTINE W REFLEX MICROSCOPIC
Bilirubin Urine: NEGATIVE
Glucose, UA: NEGATIVE mg/dL
Hgb urine dipstick: NEGATIVE
Ketones, ur: NEGATIVE mg/dL
Leukocytes,Ua: NEGATIVE
Nitrite: NEGATIVE
Protein, ur: NEGATIVE mg/dL
Specific Gravity, Urine: 1.012 (ref 1.005–1.030)
pH: 7 (ref 5.0–8.0)

## 2023-01-11 LAB — TROPONIN I (HIGH SENSITIVITY)
Troponin I (High Sensitivity): 9 ng/L (ref <18)
Troponin I (High Sensitivity): 8 ng/L (ref <18)

## 2023-01-11 LAB — CBC WITH DIFFERENTIAL/PLATELET
Abs Immature Granulocytes: 0.03 10*3/uL (ref 0.00–0.07)
Basophils Absolute: 0.1 10*3/uL (ref 0.0–0.1)
Basophils Relative: 2 %
Eosinophils Absolute: 0.3 10*3/uL (ref 0.0–0.5)
Eosinophils Relative: 5 %
HCT: 30.5 % — ABNORMAL LOW (ref 39.0–52.0)
Hemoglobin: 9.6 g/dL — ABNORMAL LOW (ref 13.0–17.0)
Immature Granulocytes: 1 %
Lymphocytes Relative: 23 %
Lymphs Abs: 1.1 10*3/uL (ref 0.7–4.0)
MCH: 28.2 pg (ref 26.0–34.0)
MCHC: 31.5 g/dL (ref 30.0–36.0)
MCV: 89.7 fL (ref 80.0–100.0)
Monocytes Absolute: 0.7 10*3/uL (ref 0.1–1.0)
Monocytes Relative: 14 %
Neutro Abs: 2.8 10*3/uL (ref 1.7–7.7)
Neutrophils Relative %: 55 %
Platelets: 169 10*3/uL (ref 150–400)
RBC: 3.4 MIL/uL — ABNORMAL LOW (ref 4.22–5.81)
RDW: 15.3 % (ref 11.5–15.5)
WBC: 5 10*3/uL (ref 4.0–10.5)
nRBC: 0 % (ref 0.0–0.2)

## 2023-01-11 LAB — COMPREHENSIVE METABOLIC PANEL
ALT: 20 U/L (ref 0–44)
AST: 48 U/L — ABNORMAL HIGH (ref 15–41)
Albumin: 2.4 g/dL — ABNORMAL LOW (ref 3.5–5.0)
Alkaline Phosphatase: 163 U/L — ABNORMAL HIGH (ref 38–126)
Anion gap: 7 (ref 5–15)
BUN: 10 mg/dL (ref 6–20)
CO2: 20 mmol/L — ABNORMAL LOW (ref 22–32)
Calcium: 8 mg/dL — ABNORMAL LOW (ref 8.9–10.3)
Chloride: 112 mmol/L — ABNORMAL HIGH (ref 98–111)
Creatinine, Ser: 0.7 mg/dL (ref 0.61–1.24)
GFR, Estimated: >60 mL/min (ref >60)
Glucose, Bld: 118 mg/dL — ABNORMAL HIGH (ref 70–99)
Potassium: 3.2 mmol/L — ABNORMAL LOW (ref 3.5–5.1)
Sodium: 139 mmol/L (ref 135–145)
Total Bilirubin: 1.8 mg/dL — ABNORMAL HIGH (ref <1.2)
Total Protein: 7.4 g/dL (ref 6.5–8.1)

## 2023-01-11 LAB — LIPASE, BLOOD: Lipase: 153 U/L — ABNORMAL HIGH (ref 11–51)

## 2023-01-11 LAB — PHOSPHORUS: Phosphorus: 2.6 mg/dL (ref 2.5–4.6)

## 2023-01-11 LAB — AMMONIA: Ammonia: 66 umol/L — ABNORMAL HIGH (ref 9–35)

## 2023-01-11 MED ORDER — ONDANSETRON HCL 4 MG/2ML IJ SOLN: 4.0000 mg | Freq: Four times a day (QID) | INTRAMUSCULAR | Status: DC | PRN

## 2023-01-11 MED ORDER — PANTOPRAZOLE SODIUM 40 MG IV SOLR
40.0000 mg | Freq: Two times a day (BID) | INTRAVENOUS | Status: DC
  Administered 2023-01-11: 40 mg via INTRAVENOUS
  Filled 2023-01-11 (×2): qty 10

## 2023-01-11 MED ORDER — PANTOPRAZOLE SODIUM 40 MG IV SOLR
40.0000 mg | Freq: Once | INTRAVENOUS | Status: AC
  Administered 2023-01-11: 40 mg via INTRAVENOUS
  Filled 2023-01-11: qty 10

## 2023-01-11 MED ORDER — HYDROMORPHONE HCL 1 MG/ML IJ SOLN
0.5000 mg | Freq: Once | INTRAMUSCULAR | Status: AC
  Administered 2023-01-11: 0.5 mg via INTRAVENOUS
  Filled 2023-01-11: qty 0.5

## 2023-01-11 MED ORDER — LORAZEPAM 1 MG PO TABS: 1.0000 mg | ORAL_TABLET | ORAL | Status: DC | PRN

## 2023-01-11 MED ORDER — ONDANSETRON HCL 4 MG PO TABS: 4.0000 mg | ORAL_TABLET | Freq: Four times a day (QID) | ORAL | Status: DC | PRN

## 2023-01-11 MED ORDER — FOLIC ACID 5 MG/ML IJ SOLN
1.0000 mg | Freq: Every day | INTRAMUSCULAR | Status: DC
  Administered 2023-01-12 – 2023-01-13 (×2): 1 mg via INTRAVENOUS
  Filled 2023-01-11 (×3): qty 0.2

## 2023-01-11 MED ORDER — SODIUM CHLORIDE 0.9 % IV BOLUS
1000.0000 mL | Freq: Once | INTRAVENOUS | Status: AC
  Administered 2023-01-11: 1000 mL via INTRAVENOUS

## 2023-01-11 MED ORDER — SODIUM CHLORIDE 0.9 % IV SOLN: INTRAVENOUS | Status: DC

## 2023-01-11 MED ORDER — LORAZEPAM 2 MG/ML IJ SOLN: 1.0000 mg | INTRAMUSCULAR | Status: DC | PRN

## 2023-01-11 MED ORDER — HYDROMORPHONE HCL 1 MG/ML IJ SOLN
0.5000 mg | INTRAMUSCULAR | Status: DC | PRN
  Administered 2023-01-11 – 2023-01-12 (×6): 0.5 mg via INTRAVENOUS
  Filled 2023-01-11 (×7): qty 0.5

## 2023-01-11 MED ORDER — ADULT MULTIVITAMIN W/MINERALS CH
1.0000 | ORAL_TABLET | Freq: Every day | ORAL | Status: DC
  Administered 2023-01-11 – 2023-01-13 (×3): 1 via ORAL
  Filled 2023-01-11 (×3): qty 1

## 2023-01-11 MED ORDER — THIAMINE HCL 100 MG/ML IJ SOLN
100.0000 mg | Freq: Every day | INTRAMUSCULAR | Status: DC
Start: 2023-01-11 — End: 2023-01-12
  Administered 2023-01-11: 100 mg via INTRAVENOUS
  Filled 2023-01-11 (×2): qty 2

## 2023-01-11 MED ORDER — ONDANSETRON HCL 4 MG/2ML IJ SOLN
4.0000 mg | Freq: Once | INTRAMUSCULAR | Status: AC
  Administered 2023-01-11: 4 mg via INTRAVENOUS
  Filled 2023-01-11: qty 2

## 2023-01-11 MED ORDER — PANTOPRAZOLE SODIUM 40 MG IV SOLR: 40.0000 mg | Freq: Two times a day (BID) | INTRAVENOUS | Status: DC

## 2023-01-11 NOTE — Assessment & Plan Note (Signed)
Patient denies any regular alcohol use though does admit to multiple drinks over the course of past week Check alcohol level CIWA protocol as appropriate
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
## 2023-01-11 NOTE — Assessment & Plan Note (Signed)
LFTs grossly stable at present Monitor

## 2023-01-11 NOTE — ED Triage Notes (Signed)
Patient ambulatory to triage with steady gait, without difficulty or distress noted; pt reports since Monday having upper abd pain; seen by UC and rx antibiotics for ?UTI/kidney issues but pain has persisted; N/V last 2 days; hx liver failure

## 2023-01-11 NOTE — Assessment & Plan Note (Addendum)
Acute on chronic pancreatitis in the setting of alcohol abuse Patient does admit to drinking multiple drinks over the past few days Lipase 153 Deferring imaging for now given recent admission for similar issues Pain medication Antiemetics IV fluids Advance diet as tolerated Discussed importance of alcohol cessation Monitor

## 2023-01-11 NOTE — Assessment & Plan Note (Signed)
Hemoglobin 14.4-->9.6 with noted concurrent acute on chronic pancreatitis associated alcohol use No reported active bleeding Will monitor for now Trend Transfuse for hemoglobin less than 7 Monitor

## 2023-01-11 NOTE — ED Notes (Signed)
Pt up to use restroom.

## 2023-01-11 NOTE — ED Provider Notes (Signed)
[facility] Provider Note    Event Date/Time   First MD Initiated Contact with Patient 01/11/23 0720     (approximate)   History   Abdominal Pain   HPI  Stephen Mayer is a 47 y.o. male with alcoholic cirrhosis who comes in for concerns for abdominal pain.  I reviewed the note from 09/04/2022. Pt has h/o alcohol use disorder, GERD, osteonecrosis, pancreatic insufficiency, alcoholic liver cirrhosis, recent hospitalization on 08/24/2022 for hematemesis. Dr. Kathi Ludwig, EGD showed portal hypertensive gastropathy, gastritis, small varices.   Patient reports having some upper abdominal pain.  Patient has had nausea and vomiting for the past 2 days and reports being on antibiotics but the pain is continued.  Patient reports having 2 weeks of upper abdominal discomfort.  He reports that he saw an urgent care about a week ago and was diagnosed with possible UTI and put on Bactrim.  He reports that he is completed this course but still having worsening symptoms.  He states that he has been having nausea, vomiting.  He denies any blood in his vomit or black stools.  He denies any chest pain, shortness of breath.  He reports having a paracentesis once before but that was not years ago.  Patient does report that he significantly cut down on alcohol use however he does report drinking a little bit due to the pain being so bad to try to help numb the pain.   Physical Exam   Triage Vital Signs: ED Triage Vitals  Encounter Vitals Group     BP 01/11/23 0705 139/83     Systolic BP Percentile --      Diastolic BP Percentile --      Pulse Rate 01/11/23 0705 89     Resp 01/11/23 0705 19     Temp 01/11/23 0705 98.1 F (36.7 C)     Temp src --      SpO2 01/11/23 0705 100 %     Weight 01/11/23 0700 190 lb (86.2 kg)     Height 01/11/23 0700 5\' 7"  (1.702 m)     Head Circumference --      Peak Flow --      Pain Score 01/11/23 0700 10     Pain Loc --      Pain Education --       Exclude from Growth Chart --     Most recent vital signs: Vitals:   01/11/23 0705  BP: 139/83  Pulse: 89  Resp: 19  Temp: 98.1 F (36.7 C)  SpO2: 100%     General: Awake, no distress.  CV:  Good peripheral perfusion.  Resp:  Normal effort.  Abd:  No distention.  Reported tenderness in the upper abdomen without any rebound or guarding. Other:     ED Results / Procedures / Treatments   Labs (all labs ordered are listed, but only abnormal results are displayed) Labs Reviewed  CBC WITH DIFFERENTIAL/PLATELET  COMPREHENSIVE METABOLIC PANEL  LIPASE, BLOOD  URINALYSIS, ROUTINE W REFLEX MICROSCOPIC  AMMONIA     EKG  My interpretation of EKG:  Normal sinus rate 93 without any ST elevation or T wave inversions, normal intervals  RADIOLOGY I have reviewed the xray personally and interpreted and no evidence of any pneumonia    PROCEDURES:  Critical Care performed: No  .1-3 Lead EKG Interpretation  Performed by: Concha Se, MD Authorized by: Concha Se, MD     Interpretation: normal  ECG rate:  90   ECG rate assessment: normal     Rhythm: sinus rhythm     Ectopy: none     Conduction: normal      MEDICATIONS ORDERED IN ED: Medications  pantoprazole (PROTONIX) injection 40 mg (has no administration in time range)  sodium chloride 0.9 % bolus 1,000 mL (1,000 mLs Intravenous New Bag/Given 01/11/23 0818)  ondansetron (ZOFRAN) injection 4 mg (4 mg Intravenous Given 01/11/23 0818)  HYDROmorphone (DILAUDID) injection 0.5 mg (0.5 mg Intravenous Given 01/11/23 0818)     IMPRESSION / MDM / ASSESSMENT AND PLAN / ED COURSE  I reviewed the triage vital signs and the nursing notes.   Patient's presentation is most consistent with acute presentation with potential threat to life or bodily function.   Patient comes in with concerns for upper abdominal discomfort.  Differential includes pancreatitis, ulcer, gastritis.  Doubt cholecystitis given prior imaging has  not shown any gallstones.  This been going on for 2 weeks and on examination he is got no rebound or guarding therefore doubt perforation.  Will proceed with blood work, treat patient's pain with Dilaudid, Zofran, fluids  INR is slightly weighted at 1.3.  CBC shows slightly low hemoglobin at 9.6 but patient denies any black stools or vomiting blood we will need to continue trending these.  His CMP shows slightly low bicarb.  His total bilirubin is downtrending to 1.8.  His lipase is elevated at 153 and on examination is exactly where his pain is at epigastric and left upper quadrant.  Ammonia slightly elevated.  Patient has allergy listed to Protonix by discussed with patient and he is okay with receiving it.  We discussed CT imaging but at this time his repeat abdominal exam is reassuring he reports some epigastric tenderness and LUQ pain worse after eating but he is got no rebound or guarding.  He has had CT imaging showing signs of chronic pancreatitis.  At this time I have low suspicion for perforation given his abdominal exam, xray re-assuring.  Patient is agreeable with this plan to hold off on CT.Marland Kitchen  However given his pancreatitis he still feeling nauseous and would prefer admission to help with symptoms.   The patient is on the cardiac monitor to evaluate for evidence of arrhythmia and/or significant heart rate changes.      FINAL CLINICAL IMPRESSION(S) / ED DIAGNOSES   Final diagnoses:  Alcohol-induced acute pancreatitis, unspecified complication status     Rx / DC Orders   ED Discharge Orders     None        Note:  This document was prepared using Dragon voice recognition software and may include unintentional dictation errors.   Concha Se, MD 01/11/23 [id number]

## 2023-01-12 ENCOUNTER — Inpatient Hospital Stay: Payer: Medicaid Other

## 2023-01-12 DIAGNOSIS — K859 Acute pancreatitis without necrosis or infection, unspecified: Secondary | ICD-10-CM | POA: Diagnosis not present

## 2023-01-12 DIAGNOSIS — K861 Other chronic pancreatitis: Secondary | ICD-10-CM | POA: Diagnosis not present

## 2023-01-12 LAB — CBC
HCT: 28.5 % — ABNORMAL LOW (ref 39.0–52.0)
Hemoglobin: 9 g/dL — ABNORMAL LOW (ref 13.0–17.0)
MCH: 28.2 pg (ref 26.0–34.0)
MCHC: 31.6 g/dL (ref 30.0–36.0)
MCV: 89.3 fL (ref 80.0–100.0)
Platelets: 149 10*3/uL — ABNORMAL LOW (ref 150–400)
RBC: 3.19 MIL/uL — ABNORMAL LOW (ref 4.22–5.81)
RDW: 15.2 % (ref 11.5–15.5)
WBC: 4.3 10*3/uL (ref 4.0–10.5)
nRBC: 0 % (ref 0.0–0.2)

## 2023-01-12 LAB — COMPREHENSIVE METABOLIC PANEL
ALT: 17 U/L (ref 0–44)
AST: 39 U/L (ref 15–41)
Albumin: 2.2 g/dL — ABNORMAL LOW (ref 3.5–5.0)
Alkaline Phosphatase: 163 U/L — ABNORMAL HIGH (ref 38–126)
Anion gap: 4 — ABNORMAL LOW (ref 5–15)
BUN: 9 mg/dL (ref 6–20)
CO2: 20 mmol/L — ABNORMAL LOW (ref 22–32)
Calcium: 7.8 mg/dL — ABNORMAL LOW (ref 8.9–10.3)
Chloride: 113 mmol/L — ABNORMAL HIGH (ref 98–111)
Creatinine, Ser: 0.81 mg/dL (ref 0.61–1.24)
GFR, Estimated: >60 mL/min (ref >60)
Glucose, Bld: 88 mg/dL (ref 70–99)
Potassium: 3.6 mmol/L (ref 3.5–5.1)
Sodium: 137 mmol/L (ref 135–145)
Total Bilirubin: 2.5 mg/dL — ABNORMAL HIGH (ref <1.2)
Total Protein: 6.7 g/dL (ref 6.5–8.1)

## 2023-01-12 MED ORDER — FAMOTIDINE IN NACL 20-0.9 MG/50ML-% IV SOLN
20.0000 mg | Freq: Two times a day (BID) | INTRAVENOUS | Status: DC
  Administered 2023-01-12 (×2): 20 mg via INTRAVENOUS
  Filled 2023-01-12 (×3): qty 50

## 2023-01-12 MED ORDER — HYDROMORPHONE HCL 1 MG/ML IJ SOLN
0.5000 mg | INTRAMUSCULAR | Status: DC | PRN
  Administered 2023-01-12 (×3): 0.5 mg via INTRAVENOUS
  Filled 2023-01-12 (×2): qty 0.5

## 2023-01-12 MED ORDER — DOCUSATE SODIUM 100 MG PO CAPS
100.0000 mg | ORAL_CAPSULE | Freq: Two times a day (BID) | ORAL | Status: DC
  Administered 2023-01-12 – 2023-01-13 (×2): 100 mg via ORAL
  Filled 2023-01-12 (×2): qty 1

## 2023-01-12 MED ORDER — OXYCODONE HCL 5 MG PO TABS
5.0000 mg | ORAL_TABLET | ORAL | Status: DC | PRN
  Administered 2023-01-13 (×3): 10 mg via ORAL
  Filled 2023-01-12 (×3): qty 2

## 2023-01-12 MED ORDER — ALUM & MAG HYDROXIDE-SIMETH 200-200-20 MG/5ML PO SUSP: 30.0000 mL | ORAL | Status: DC | PRN

## 2023-01-12 NOTE — Plan of Care (Signed)
Problem: Education: Goal: Knowledge of General Education information will improve Description: Including pain rating scale, medication(s)/side effects and non-pharmacologic comfort measures Outcome: Progressing   Problem: Health Behavior/Discharge Planning: Goal: Ability to manage health-related needs will improve Outcome: Progressing   Problem: Clinical Measurements: Goal: Ability to maintain clinical measurements within normal limits will improve Outcome: Progressing   Problem: Activity: Goal: Risk for activity intolerance will decrease Outcome: Progressing   Problem: Nutrition: Goal: Adequate nutrition will be maintained Outcome: Progressing   Problem: Coping: Goal: Level of anxiety will decrease Outcome: Progressing   Problem: Elimination: Goal: Will not experience complications related to bowel motility Outcome: Progressing   Problem: Pain Management: Goal: General experience of comfort will improve Outcome: Progressing   Problem: Safety: Goal: Ability to remain free from injury will improve Outcome: Progressing   Problem: Skin Integrity: Goal: Risk for impaired skin integrity will decrease Outcome: Progressing
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
## 2023-01-13 ENCOUNTER — Encounter: Admission: RE | Admit: 2023-01-13 | Payer: Medicaid Other | Source: Ambulatory Visit

## 2023-01-13 DIAGNOSIS — K859 Acute pancreatitis without necrosis or infection, unspecified: Secondary | ICD-10-CM | POA: Diagnosis not present

## 2023-01-13 DIAGNOSIS — K861 Other chronic pancreatitis: Secondary | ICD-10-CM | POA: Diagnosis not present

## 2023-01-13 MED ORDER — ONDANSETRON HCL 4 MG PO TABS: 4.0000 mg | ORAL_TABLET | Freq: Four times a day (QID) | ORAL | 0 refills | Status: DC | PRN

## 2023-01-13 MED ORDER — OMEPRAZOLE 20 MG PO CPDR: 20.0000 mg | DELAYED_RELEASE_CAPSULE | Freq: Two times a day (BID) | ORAL | 0 refills | Status: DC

## 2023-01-13 MED ORDER — FAMOTIDINE 20 MG PO TABS
20.0000 mg | ORAL_TABLET | Freq: Two times a day (BID) | ORAL | Status: DC
  Administered 2023-01-13: 20 mg via ORAL
  Filled 2023-01-13: qty 1

## 2023-01-13 MED ORDER — FAMOTIDINE 20 MG PO TABS: 20.0000 mg | ORAL_TABLET | Freq: Two times a day (BID) | ORAL | 0 refills | Status: AC

## 2023-01-13 MED ORDER — FOLIC ACID 1 MG PO TABS: 1.0000 mg | ORAL_TABLET | Freq: Every day | ORAL | Status: DC

## 2023-01-13 MED ORDER — OXYCODONE HCL 5 MG PO TABS: 5.0000 mg | ORAL_TABLET | Freq: Three times a day (TID) | ORAL | 0 refills | Status: DC | PRN

## 2023-01-13 NOTE — TOC CM/SW Note (Addendum)
TOC consult for SA resources. Confirmed resources had already been added in ED.  Alfonso Ramus, LCSW Transitions of Care Department [phone]
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
## 2023-01-13 NOTE — Progress Notes (Signed)
Pt declines wheelchair transport to front entrance.

## 2023-01-16 ENCOUNTER — Ambulatory Visit: Payer: Medicaid Other | Admitting: Physician Assistant

## 2023-01-17 ENCOUNTER — Encounter
Admit: 2023-01-17 | Discharge: 2023-01-17 | Disposition: A | Discharge status: Home / Self Care | Payer: Medicaid Other | Attending: Orthopedic Surgery | Admitting: Orthopedic Surgery

## 2023-01-17 ENCOUNTER — Encounter
Admission: RE | Admit: 2023-01-17 | Discharge: 2023-01-17 | Disposition: A | Discharge status: Home / Self Care | Payer: Medicaid Other | Source: Ambulatory Visit | Attending: Orthopedic Surgery | Admitting: Orthopedic Surgery

## 2023-01-17 DIAGNOSIS — Z96642 Presence of left artificial hip joint: Secondary | ICD-10-CM | POA: Diagnosis present

## 2023-01-17 MED ORDER — TECHNETIUM TC 99M MEDRONATE IV KIT
20.0000 | PACK | Freq: Once | INTRAVENOUS | Status: AC | PRN
  Administered 2023-01-17: 21.6 via INTRAVENOUS

## 2023-01-18 ENCOUNTER — Telehealth: Payer: Self-pay | Admitting: Physician Assistant

## 2023-01-18 ENCOUNTER — Telehealth: Payer: Self-pay

## 2023-01-18 NOTE — Telephone Encounter (Signed)
Patient called asking for referral to Ortho and Urology since he hasn't heard from when he went to urgent care. Spoke to [employer] about this and she stated yes he was suppose to give [place] a call if he hasn't heard from anyone in two weeks, Lauren has put both referrals in and Patient was notified.

## 2023-01-18 NOTE — Telephone Encounter (Signed)
Orthopedic Surgery referral sent via [employer] to [facility].  Notified patient. Gave telephone # [phone]

## 2023-01-26 ENCOUNTER — Encounter: Payer: Self-pay | Admitting: Emergency Medicine

## 2023-01-26 ENCOUNTER — Other Ambulatory Visit: Payer: Self-pay

## 2023-01-26 ENCOUNTER — Emergency Department
Admission: EM | Admit: 2023-01-26 | Discharge: 2023-01-26 | Disposition: A | Discharge status: Home / Self Care | Payer: Medicaid Other | Attending: Emergency Medicine | Admitting: Emergency Medicine

## 2023-01-26 DIAGNOSIS — K860 Alcohol-induced chronic pancreatitis: Secondary | ICD-10-CM | POA: Insufficient documentation

## 2023-01-26 DIAGNOSIS — R1013 Epigastric pain: Secondary | ICD-10-CM | POA: Diagnosis present

## 2023-01-26 LAB — CBC WITH DIFFERENTIAL/PLATELET
Abs Immature Granulocytes: 0.01 10*3/uL (ref 0.00–0.07)
Basophils Absolute: 0.1 10*3/uL (ref 0.0–0.1)
Basophils Relative: 1 %
Eosinophils Absolute: 0.2 10*3/uL (ref 0.0–0.5)
Eosinophils Relative: 3 %
HCT: 36.7 % — ABNORMAL LOW (ref 39.0–52.0)
Hemoglobin: 11.5 g/dL — ABNORMAL LOW (ref 13.0–17.0)
Immature Granulocytes: 0 %
Lymphocytes Relative: 24 %
Lymphs Abs: 1.2 10*3/uL (ref 0.7–4.0)
MCH: 27.4 pg (ref 26.0–34.0)
MCHC: 31.3 g/dL (ref 30.0–36.0)
MCV: 87.4 fL (ref 80.0–100.0)
Monocytes Absolute: 0.7 10*3/uL (ref 0.1–1.0)
Monocytes Relative: 15 %
Neutro Abs: 2.7 10*3/uL (ref 1.7–7.7)
Neutrophils Relative %: 57 %
Platelets: 296 10*3/uL (ref 150–400)
RBC: 4.2 MIL/uL — ABNORMAL LOW (ref 4.22–5.81)
RDW: 14.7 % (ref 11.5–15.5)
WBC: 4.8 10*3/uL (ref 4.0–10.5)
nRBC: 0 % (ref 0.0–0.2)

## 2023-01-26 LAB — COMPREHENSIVE METABOLIC PANEL
ALT: 15 U/L (ref 0–44)
AST: 43 U/L — ABNORMAL HIGH (ref 15–41)
Albumin: 2.8 g/dL — ABNORMAL LOW (ref 3.5–5.0)
Alkaline Phosphatase: 160 U/L — ABNORMAL HIGH (ref 38–126)
Anion gap: 8 (ref 5–15)
BUN: 10 mg/dL (ref 6–20)
CO2: 20 mmol/L — ABNORMAL LOW (ref 22–32)
Calcium: 8.8 mg/dL — ABNORMAL LOW (ref 8.9–10.3)
Chloride: 107 mmol/L (ref 98–111)
Creatinine, Ser: 0.84 mg/dL (ref 0.61–1.24)
GFR, Estimated: >60 mL/min (ref >60)
Glucose, Bld: 101 mg/dL — ABNORMAL HIGH (ref 70–99)
Potassium: 3.7 mmol/L (ref 3.5–5.1)
Sodium: 135 mmol/L (ref 135–145)
Total Bilirubin: 2.4 mg/dL — ABNORMAL HIGH (ref <1.2)
Total Protein: 8.3 g/dL — ABNORMAL HIGH (ref 6.5–8.1)

## 2023-01-26 LAB — ETHANOL: Alcohol, Ethyl (B): <10 mg/dL (ref <10)

## 2023-01-26 LAB — LIPASE, BLOOD: Lipase: 119 U/L — ABNORMAL HIGH (ref 11–51)

## 2023-01-26 LAB — AMMONIA: Ammonia: 33 umol/L (ref 9–35)

## 2023-01-26 MED ORDER — SODIUM CHLORIDE 0.9 % IV BOLUS (SEPSIS): 1000.0000 mL | Freq: Once | INTRAVENOUS | Status: DC

## 2023-01-26 MED ORDER — ONDANSETRON HCL 4 MG/2ML IJ SOLN: 4.0000 mg | Freq: Once | INTRAMUSCULAR | Status: DC

## 2023-01-26 MED ORDER — ONDANSETRON 8 MG PO TBDP: 8.0000 mg | ORAL_TABLET | Freq: Three times a day (TID) | ORAL | 0 refills | Status: DC | PRN

## 2023-01-26 MED ORDER — HYDROCODONE-ACETAMINOPHEN 5-325 MG PO TABS: 1.0000 | ORAL_TABLET | ORAL | 0 refills | Status: DC | PRN

## 2023-01-26 NOTE — ED Provider Notes (Signed)
[facility] Provider Note   Event Date/Time   First MD Initiated Contact with Patient 01/26/23 [phone]     (approximate) History  Abdominal Pain  HPI Stephen Mayer is a 47 y.o. male with a stated past medical history of alcohol-induced chronic pancreatitis who presents for midepigastric pain has been worsening over the last 2 days with associated nausea/vomiting.  Patient states that this is similar to previous pancreatitis episodes that he has had in the past.  Patient states that food worsens this pain and he denies any relieving factors. ROS: Patient currently denies any vision changes, tinnitus, difficulty speaking, facial droop, sore throat, chest pain, shortness of breath, diarrhea, dysuria, or weakness/numbness/paresthesias in any extremity   Physical Exam  Triage Vital Signs: ED Triage Vitals  Encounter Vitals Group     BP 01/26/23 0614 121/82     Systolic BP Percentile --      Diastolic BP Percentile --      Pulse Rate 01/26/23 0614 (!) 102     Resp 01/26/23 0614 18     Temp 01/26/23 0614 97.8 F (36.6 C)     Temp Source 01/26/23 0614 Oral     SpO2 01/26/23 0621 100 %     Weight 01/26/23 0614 190 lb (86.2 kg)     Height 01/26/23 0614 5\' 7"  (1.702 m)     Head Circumference --      Peak Flow --      Pain Score 01/26/23 0621 10     Pain Loc --      Pain Education --      Exclude from Growth Chart --    Most recent vital signs: Vitals:   01/26/23 0614 01/26/23 0621  BP: 121/82   Pulse: (!) 102   Resp: 18   Temp: 97.8 F (36.6 C)   SpO2:  100%   General: Awake, oriented x4. CV:  Good peripheral perfusion.  Resp:  Normal effort.  Abd:  No distention.  Epigastric tenderness to palpation Other:  Middle-aged overweight disheveled Caucasian male resting comfortably in no acute distress ED Results / Procedures / Treatments  Labs (all labs ordered are listed, but only abnormal results are displayed) Labs Reviewed  CBC WITH  DIFFERENTIAL/PLATELET - Abnormal; Notable for the following components:      Result Value   RBC 4.20 (*)    Hemoglobin 11.5 (*)    HCT 36.7 (*)    All other components within normal limits  COMPREHENSIVE METABOLIC PANEL - Abnormal; Notable for the following components:   CO2 20 (*)    Glucose, Bld 101 (*)    Calcium 8.8 (*)    Total Protein 8.3 (*)    Albumin 2.8 (*)    AST 43 (*)    Alkaline Phosphatase 160 (*)    Total Bilirubin 2.4 (*)    All other components within normal limits  LIPASE, BLOOD - Abnormal; Notable for the following components:   Lipase 119 (*)    All other components within normal limits  AMMONIA  ETHANOL  URINALYSIS, ROUTINE W REFLEX MICROSCOPIC   EKG ED ECG REPORT I, Merwyn Katos, the attending physician, personally viewed and interpreted this ECG. Date: 01/26/2023 EKG Time: 0618 Rate: 101 Rhythm: Tachycardic sinus rhythm QRS Axis: normal Intervals: normal ST/T Wave abnormalities: normal Narrative Interpretation: Tachycardic sinus rhythm.  No evidence of acute ischemia PROCEDURES: Critical Care performed: No Procedures MEDICATIONS ORDERED IN ED: Medications  sodium chloride 0.9 % bolus 1,000  mL (has no administration in time range)  ondansetron (ZOFRAN) injection 4 mg (has no administration in time range)   IMPRESSION / MDM / ASSESSMENT AND PLAN / ED COURSE  I reviewed the triage vital signs and the nursing notes.                             The patient is on the cardiac monitor to evaluate for evidence of arrhythmia and/or significant heart rate changes. Patient's presentation is most consistent with acute presentation with potential threat to life or bodily function. Given history and exam I have a low suspicion for AAA, SBO, appendicitis, mesenteric ischemia, nephrolithiasis, pyelonephritis, or diverticulitis. I have a moderate concern for pancreatitis, gastritis vs non-bleeding peptic ulcer, or hepatobiliary disease and thus will obtain labs  and imaging.  ED Workup: CBC, BMP, LFTs, Lipase  No signs of severe pancreatitis on exam such as ecchymosis of periumbilical region or flanks, hypoxia, or tachypneia.  Disposition: Considered admission for this patient however he is p.o. tolerant, pain is well-controlled even prior to analgesic administration and he is amenable to outpatient treatment with clear liquid diet and advancement as tolerated as well as home analgesic prescription.   FINAL CLINICAL IMPRESSION(S) / ED DIAGNOSES   Final diagnoses:  Alcohol-induced chronic pancreatitis (HCC)  Epigastric pain   Rx / DC Orders   ED Discharge Orders          Ordered    HYDROcodone-acetaminophen (NORCO) 5-325 MG tablet  Every 4 hours PRN        01/26/23 0714    ondansetron (ZOFRAN-ODT) 8 MG disintegrating tablet  Every 8 hours PRN        01/26/23 8756           Note:  This document was prepared using Dragon voice recognition software and may include unintentional dictation errors.   Merwyn Katos, MD 01/26/23 [phone]

## 2023-01-26 NOTE — ED Triage Notes (Signed)
Patient ambulatory to triage with steady gait, without difficulty or distress noted; pt reports since last night having mid upper abd pain accomp by N/V; st hx of chronic pancreatitis

## 2023-02-02 ENCOUNTER — Telehealth: Payer: Self-pay

## 2023-02-02 ENCOUNTER — Other Ambulatory Visit: Payer: Self-pay

## 2023-02-02 DIAGNOSIS — K219 Gastro-esophageal reflux disease without esophagitis: Secondary | ICD-10-CM

## 2023-02-02 MED ORDER — OMEPRAZOLE 20 MG PO CPDR: 20.0000 mg | DELAYED_RELEASE_CAPSULE | Freq: Two times a day (BID) | ORAL | 0 refills | Status: DC

## 2023-02-02 NOTE — Telephone Encounter (Signed)
Pt called that hospital change omeprazole 20 mg take 1 tab po bid as per lauren sent and advised him need follow up appt to discuss further treatment

## 2023-02-06 ENCOUNTER — Other Ambulatory Visit: Payer: Self-pay

## 2023-02-06 ENCOUNTER — Emergency Department
Admission: EM | Admit: 2023-02-06 | Discharge: 2023-02-06 | Disposition: A | Discharge status: Home / Self Care | Payer: Medicaid Other | Attending: Emergency Medicine | Admitting: Emergency Medicine

## 2023-02-06 DIAGNOSIS — M25552 Pain in left hip: Secondary | ICD-10-CM | POA: Insufficient documentation

## 2023-02-06 DIAGNOSIS — Z96643 Presence of artificial hip joint, bilateral: Secondary | ICD-10-CM | POA: Insufficient documentation

## 2023-02-06 MED ORDER — HYDROCODONE-ACETAMINOPHEN 5-325 MG PO TABS: 1.0000 | ORAL_TABLET | Freq: Four times a day (QID) | ORAL | 0 refills | Status: AC | PRN

## 2023-02-06 MED ORDER — KETOROLAC TROMETHAMINE 30 MG/ML IJ SOLN
30.0000 mg | Freq: Once | INTRAMUSCULAR | Status: AC
  Administered 2023-02-06: 30 mg via INTRAMUSCULAR
  Filled 2023-02-06: qty 1
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
## 2023-02-06 NOTE — Discharge Instructions (Addendum)
Please follow-up with orthopedic.  Call and schedule an appointment for further evaluation of chronic hip pain.  In the interim, apply ice to the affected area for 20 minutes on 20 minutes off alternate this with a heating pad to help reduce swelling.
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
## 2023-02-13 ENCOUNTER — Ambulatory Visit: Payer: Medicaid Other | Admitting: Urology

## 2023-02-23 ENCOUNTER — Ambulatory Visit (INDEPENDENT_AMBULATORY_CARE_PROVIDER_SITE_OTHER): Payer: Medicaid Other | Admitting: Orthopedic Surgery

## 2023-02-23 DIAGNOSIS — M25552 Pain in left hip: Secondary | ICD-10-CM | POA: Diagnosis not present

## 2023-02-23 DIAGNOSIS — G8929 Other chronic pain: Secondary | ICD-10-CM

## 2023-02-23 DIAGNOSIS — M7062 Trochanteric bursitis, left hip: Secondary | ICD-10-CM | POA: Diagnosis not present

## 2023-02-23 MED ORDER — HYDROCODONE-ACETAMINOPHEN 5-325 MG PO TABS: 1.0000 | ORAL_TABLET | Freq: Four times a day (QID) | ORAL | 0 refills | Status: DC | PRN

## 2023-02-23 MED ORDER — TRIAMCINOLONE ACETONIDE 10 MG/ML IJ SUSP: 1.0000 mg | Freq: Once | INTRAMUSCULAR | Status: DC

## 2023-02-23 NOTE — Patient Instructions (Signed)
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
## 2023-02-27 NOTE — Addendum Note (Signed)
 Addended by: Cherre Huger E on: 02/27/2023 10:11 AM   Modules accepted: Orders

## 2023-03-03 ENCOUNTER — Emergency Department: Payer: Medicaid Other

## 2023-03-03 ENCOUNTER — Other Ambulatory Visit: Payer: Self-pay

## 2023-03-03 ENCOUNTER — Emergency Department
Admission: EM | Admit: 2023-03-03 | Discharge: 2023-03-03 | Disposition: A | Discharge status: Home / Self Care | Payer: Medicaid Other | Attending: Student in an Organized Health Care Education/Training Program | Admitting: Student in an Organized Health Care Education/Training Program

## 2023-03-03 DIAGNOSIS — M25552 Pain in left hip: Secondary | ICD-10-CM | POA: Insufficient documentation

## 2023-03-03 LAB — CBC
HCT: 34.8 % — ABNORMAL LOW (ref 39.0–52.0)
Hemoglobin: 10.8 g/dL — ABNORMAL LOW (ref 13.0–17.0)
MCH: 26.9 pg (ref 26.0–34.0)
MCHC: 31 g/dL (ref 30.0–36.0)
MCV: 86.6 fL (ref 80.0–100.0)
Platelets: 310 10*3/uL (ref 150–400)
RBC: 4.02 MIL/uL — ABNORMAL LOW (ref 4.22–5.81)
RDW: 17.2 % — ABNORMAL HIGH (ref 11.5–15.5)
WBC: 4.6 10*3/uL (ref 4.0–10.5)
nRBC: 0 % (ref 0.0–0.2)

## 2023-03-03 LAB — COMPREHENSIVE METABOLIC PANEL
ALT: 26 U/L (ref 0–44)
AST: 49 U/L — ABNORMAL HIGH (ref 15–41)
Albumin: 3 g/dL — ABNORMAL LOW (ref 3.5–5.0)
Alkaline Phosphatase: 140 U/L — ABNORMAL HIGH (ref 38–126)
Anion gap: 7 (ref 5–15)
BUN: 13 mg/dL (ref 6–20)
CO2: 22 mmol/L (ref 22–32)
Calcium: 8.8 mg/dL — ABNORMAL LOW (ref 8.9–10.3)
Chloride: 109 mmol/L (ref 98–111)
Creatinine, Ser: 0.86 mg/dL (ref 0.61–1.24)
GFR, Estimated: >60 mL/min (ref >60)
Glucose, Bld: 96 mg/dL (ref 70–99)
Potassium: 3.7 mmol/L (ref 3.5–5.1)
Sodium: 138 mmol/L (ref 135–145)
Total Bilirubin: 1.6 mg/dL — ABNORMAL HIGH (ref 0.0–1.2)
Total Protein: 8.5 g/dL — ABNORMAL HIGH (ref 6.5–8.1)

## 2023-03-03 LAB — LIPASE, BLOOD: Lipase: 81 U/L — ABNORMAL HIGH (ref 11–51)

## 2023-03-03 LAB — LACTIC ACID, PLASMA: Lactic Acid, Venous: 1.7 mmol/L (ref 0.5–1.9)

## 2023-03-03 MED ORDER — OXYCODONE HCL 5 MG PO TABS: 5.0000 mg | ORAL_TABLET | ORAL | Status: DC | PRN

## 2023-03-03 MED ORDER — OXYCODONE HCL 5 MG PO TABS: 5.0000 mg | ORAL_TABLET | ORAL | 0 refills | Status: DC | PRN
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
## 2023-03-03 NOTE — ED Notes (Signed)
See triage notes. Patient c/o left hip pain and swelling. Patient has hx of bursitis and was sent here for possible septic bursitis.

## 2023-03-03 NOTE — ED Triage Notes (Signed)
Pt presents via POV c/o swelling and pain in the left hip. Reports hx of bursitis in left hip and seen at [facility] and told to come to ED for possible septic bursitis of left hip.   Ambulatory to triage. A&O x4.

## 2023-03-06 ENCOUNTER — Ambulatory Visit: Payer: Medicaid Other | Admitting: Physician Assistant

## 2023-03-06 ENCOUNTER — Encounter: Payer: Self-pay | Admitting: Physician Assistant

## 2023-03-06 VITALS — BP 129/85 | HR 111 | Temp 98.3°F | Resp 16 | Ht 67.0 in | Wt 180.0 lb

## 2023-03-06 DIAGNOSIS — K7030 Alcoholic cirrhosis of liver without ascites: Secondary | ICD-10-CM | POA: Diagnosis not present

## 2023-03-06 DIAGNOSIS — M25552 Pain in left hip: Secondary | ICD-10-CM | POA: Diagnosis not present

## 2023-03-06 DIAGNOSIS — K219 Gastro-esophageal reflux disease without esophagitis: Secondary | ICD-10-CM | POA: Diagnosis not present

## 2023-03-06 MED ORDER — OMEPRAZOLE 20 MG PO CPDR: DELAYED_RELEASE_CAPSULE | ORAL | 1 refills | Status: DC

## 2023-03-06 MED ORDER — OXYCODONE HCL 5 MG PO TABS: 5.0000 mg | ORAL_TABLET | Freq: Two times a day (BID) | ORAL | 0 refills | Status: DC | PRN
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
## 2023-03-23 ENCOUNTER — Encounter: Payer: Self-pay | Admitting: Intensive Care

## 2023-03-23 ENCOUNTER — Other Ambulatory Visit: Payer: Self-pay

## 2023-03-23 ENCOUNTER — Emergency Department
Admission: EM | Admit: 2023-03-23 | Discharge: 2023-03-23 | Disposition: A | Discharge status: Home / Self Care | Payer: Medicaid Other | Attending: Emergency Medicine | Admitting: Emergency Medicine

## 2023-03-23 DIAGNOSIS — N189 Chronic kidney disease, unspecified: Secondary | ICD-10-CM | POA: Diagnosis not present

## 2023-03-23 DIAGNOSIS — M25552 Pain in left hip: Secondary | ICD-10-CM | POA: Insufficient documentation

## 2023-03-23 LAB — CBC WITH DIFFERENTIAL/PLATELET
Abs Immature Granulocytes: 0.02 10*3/uL (ref 0.00–0.07)
Basophils Absolute: 0 10*3/uL (ref 0.0–0.1)
Basophils Relative: 1 %
Eosinophils Absolute: 0.1 10*3/uL (ref 0.0–0.5)
Eosinophils Relative: 2 %
HCT: 31.5 % — ABNORMAL LOW (ref 39.0–52.0)
Hemoglobin: 9.8 g/dL — ABNORMAL LOW (ref 13.0–17.0)
Immature Granulocytes: 0 %
Lymphocytes Relative: 19 %
Lymphs Abs: 1 10*3/uL (ref 0.7–4.0)
MCH: 25.7 pg — ABNORMAL LOW (ref 26.0–34.0)
MCHC: 31.1 g/dL (ref 30.0–36.0)
MCV: 82.7 fL (ref 80.0–100.0)
Monocytes Absolute: 0.6 10*3/uL (ref 0.1–1.0)
Monocytes Relative: 11 %
Neutro Abs: 3.7 10*3/uL (ref 1.7–7.7)
Neutrophils Relative %: 67 %
Platelets: 180 10*3/uL (ref 150–400)
RBC: 3.81 MIL/uL — ABNORMAL LOW (ref 4.22–5.81)
RDW: 17.3 % — ABNORMAL HIGH (ref 11.5–15.5)
WBC: 5.5 10*3/uL (ref 4.0–10.5)
nRBC: 0 % (ref 0.0–0.2)

## 2023-03-23 LAB — COMPREHENSIVE METABOLIC PANEL
ALT: 31 U/L (ref 0–44)
AST: 52 U/L — ABNORMAL HIGH (ref 15–41)
Albumin: 2.9 g/dL — ABNORMAL LOW (ref 3.5–5.0)
Alkaline Phosphatase: 136 U/L — ABNORMAL HIGH (ref 38–126)
Anion gap: 8 (ref 5–15)
BUN: 13 mg/dL (ref 6–20)
CO2: 20 mmol/L — ABNORMAL LOW (ref 22–32)
Calcium: 8.8 mg/dL — ABNORMAL LOW (ref 8.9–10.3)
Chloride: 108 mmol/L (ref 98–111)
Creatinine, Ser: 0.74 mg/dL (ref 0.61–1.24)
GFR, Estimated: >60 mL/min (ref >60)
Glucose, Bld: 85 mg/dL (ref 70–99)
Potassium: 3.9 mmol/L (ref 3.5–5.1)
Sodium: 136 mmol/L (ref 135–145)
Total Bilirubin: 2 mg/dL — ABNORMAL HIGH (ref 0.0–1.2)
Total Protein: 8.4 g/dL — ABNORMAL HIGH (ref 6.5–8.1)

## 2023-03-23 MED ORDER — OXYCODONE-ACETAMINOPHEN 10-325 MG PO TABS: 1.0000 | ORAL_TABLET | Freq: Three times a day (TID) | ORAL | 0 refills | Status: DC | PRN

## 2023-03-23 MED ORDER — TIZANIDINE HCL 4 MG PO TABS: 4.0000 mg | ORAL_TABLET | Freq: Three times a day (TID) | ORAL | 0 refills | Status: DC

## 2023-03-23 NOTE — Discharge Instructions (Signed)
 Take the pain medication as sparingly as possible.  Keep your scheduled appointment with the pain management clinic.  Return to the ER for symptoms that change or worsen if unable to see primary care or the specialist.
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
## 2023-03-23 NOTE — ED Notes (Signed)
 See triage note  Presents with pain from left hip and radiating into left leg  Ambulates with slight limp
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
## 2023-03-27 ENCOUNTER — Telehealth: Payer: Self-pay

## 2023-03-27 ENCOUNTER — Other Ambulatory Visit: Payer: Self-pay | Admitting: Physician Assistant

## 2023-03-27 MED ORDER — HYDROCHLOROTHIAZIDE 12.5 MG PO TABS
12.5000 mg | ORAL_TABLET | Freq: Every day | ORAL | 0 refills | Status: AC
Start: 2023-03-27 — End: ?

## 2023-03-27 NOTE — Telephone Encounter (Signed)
 Patient notified

## 2023-03-30 ENCOUNTER — Other Ambulatory Visit: Payer: Self-pay | Admitting: Orthopedic Surgery

## 2023-03-30 DIAGNOSIS — T8484XA Pain due to internal orthopedic prosthetic devices, implants and grafts, initial encounter: Secondary | ICD-10-CM

## 2023-03-30 DIAGNOSIS — M25552 Pain in left hip: Secondary | ICD-10-CM

## 2023-04-10 ENCOUNTER — Encounter: Payer: Self-pay | Admitting: Physician Assistant

## 2023-04-10 ENCOUNTER — Ambulatory Visit: Payer: Medicaid Other | Admitting: Physician Assistant

## 2023-04-10 ENCOUNTER — Ambulatory Visit
Admission: RE | Admit: 2023-04-10 | Discharge: 2023-04-10 | Disposition: A | Discharge status: Home / Self Care | Payer: Medicaid Other | Source: Ambulatory Visit | Attending: Orthopedic Surgery | Admitting: Orthopedic Surgery

## 2023-04-10 VITALS — BP 138/77 | HR 102 | Temp 98.0°F | Resp 16 | Ht 67.0 in | Wt 186.0 lb

## 2023-04-10 DIAGNOSIS — T8484XA Pain due to internal orthopedic prosthetic devices, implants and grafts, initial encounter: Secondary | ICD-10-CM | POA: Insufficient documentation

## 2023-04-10 DIAGNOSIS — M25552 Pain in left hip: Secondary | ICD-10-CM | POA: Diagnosis present

## 2023-04-10 DIAGNOSIS — Z96649 Presence of unspecified artificial hip joint: Secondary | ICD-10-CM | POA: Diagnosis present

## 2023-04-10 MED ORDER — OXYCODONE HCL 5 MG PO TABS: 5.0000 mg | ORAL_TABLET | Freq: Two times a day (BID) | ORAL | 0 refills | Status: AC | PRN

## 2023-04-10 NOTE — Progress Notes (Signed)
 [facility] [address] [place], [place] [address]  Internal MEDICINE  Office Visit Note  Patient Name: Stephen Mayer  [id number]  [id number]  Date of Service: 04/14/2023  Chief Complaint  Patient presents with   Follow-up    Referral for Pain management    Arthritis    HPI Pt is here for routine follow up -Did have MRI this morning, no results yet. This was ordered by ortho at [facility].(Seen 03/29/23)  -Will be seeing pain management on March 12, he states this appt has been rescheduled 2 times, therefore he is requesting short term pain meds again. Previously discussed that we would not prescribe further pain medications moving forward, therefore this will be last refill. Pt expressed understanding. Does not need new referral. -states he did not discuss pain meds at time of ortho visit as he had just been to ED a few days prior. In addition to establishing with pain clinic next month, he should follow up with ortho regarding treatment plan moving forward  Current Medication: Outpatient Encounter Medications as of 04/10/2023  Medication Sig   hydrochlorothiazide (HYDRODIURIL) 12.5 MG tablet Take 1 tablet (12.5 mg total) by mouth daily.   Multiple Vitamin (MULTIVITAMIN WITH MINERALS) TABS tablet Take 1 tablet by mouth daily.   omeprazole (PRILOSEC) 20 MG capsule Take 1 capsule by mouth daily   ondansetron (ZOFRAN-ODT) 8 MG disintegrating tablet Take 1 tablet (8 mg total) by mouth every 8 (eight) hours as needed for nausea or vomiting.   oxyCODONE (OXY IR/ROXICODONE) 5 MG immediate release tablet Take 1 tablet (5 mg total) by mouth 2 (two) times daily as needed for up to 7 days for severe pain (pain score 7-10).   tamsulosin (FLOMAX) 0.4 MG CAPS capsule Take 0.4 mg by mouth daily.   tiZANidine (ZANAFLEX) 4 MG tablet Take 1 tablet (4 mg total) by mouth 3 (three) times daily.   [DISCONTINUED] oxyCODONE-acetaminophen (PERCOCET) 10-325 MG tablet Take 1 tablet by mouth every  8 (eight) hours as needed for pain.   famotidine (PEPCID) 20 MG tablet Take 1 tablet (20 mg total) by mouth 2 (two) times daily for 14 days.   No facility-administered encounter medications on file as of 04/10/2023.    Surgical History: Past Surgical History:  Procedure Laterality Date   ESOPHAGOGASTRODUODENOSCOPY (EGD) WITH PROPOFOL N/A 08/27/2022   Procedure: ESOPHAGOGASTRODUODENOSCOPY (EGD) WITH PROPOFOL;  Surgeon: Regis Bill, MD;  Location: [facility] ENDOSCOPY;  Service: Endoscopy;  Laterality: N/A;   REPLACEMENT TOTAL KNEE Bilateral 2021   TOTAL HIP ARTHROPLASTY Bilateral 2021    Medical History: Past Medical History:  Diagnosis Date   Arthritis    CKD (chronic kidney disease)    Hematemesis with nausea 08/25/2022   Liver failure (HCC)    Osteonecrosis (HCC)     Family History: Family History  Problem Relation Age of Onset   Kidney disease Mother    Diabetes Mother    Diabetes Brother     Social History   Socioeconomic History   Marital status: Single    Spouse name: Not on file   Number of children: Not on file   Years of education: Not on file   Highest education level: Not on file  Occupational History   Not on file  Tobacco Use   Smoking status: Every Day    Types: Cigarettes   Smokeless tobacco: Never   Tobacco comments:    3 cigarettes a day   Vaping Use   Vaping status: Never Used  Substance and Sexual Activity   Alcohol use: Yes    Comment: rare   Drug use: Not Currently   Sexual activity: Yes  Other Topics Concern   Not on file  Social History Narrative   Not on file   Social Drivers of Health   Financial Resource Strain: Medium Risk (03/29/2023)   Received from [facility]   Overall Financial Resource Strain (CARDIA)    Difficulty of Paying Living Expenses: Somewhat hard  Food Insecurity: Food Insecurity Present (03/29/2023)   Received from [facility]   Hunger Vital Sign    Worried About Running  Out of Food in the Last Year: Sometimes true    Ran Out of Food in the Last Year: Sometimes true  Transportation Needs: Unmet Transportation Needs (03/29/2023)   Received from [facility]   PRAPARE - Transportation    In the past 12 months, has lack of transportation kept you from medical appointments or from getting medications?: Yes    Lack of Transportation (Non-Medical): Yes  Physical Activity: Not on file  Stress: Not on file  Social Connections: Not on file  Intimate Partner Violence: Not At Risk (01/12/2023)   Humiliation, Afraid, Rape, and Kick questionnaire    Fear of Current or Ex-Partner: No    Emotionally Abused: No    Physically Abused: No    Sexually Abused: No      Review of Systems  Constitutional:  Negative for chills, fatigue and unexpected weight change.  HENT:  Negative for congestion, postnasal drip, rhinorrhea, sneezing and sore throat.   Eyes:  Negative for redness.  Respiratory:  Negative for cough, chest tightness and shortness of breath.   Cardiovascular:  Negative for chest pain and palpitations.  Gastrointestinal:  Negative for constipation, diarrhea, nausea and vomiting.  Genitourinary:  Negative for frequency.  Musculoskeletal:  Positive for arthralgias.  Skin:  Negative for rash.  Neurological:  Positive for numbness. Negative for tremors.  Hematological:  Negative for adenopathy. Does not bruise/bleed easily.  Psychiatric/Behavioral:  Negative for behavioral problems (Depression), sleep disturbance and suicidal ideas.     Vital Signs: BP 138/77   Pulse (!) 102   Temp 98 F (36.7 C)   Resp 16   Ht 5\' 7"  (1.702 m)   Wt 186 lb (84.4 kg)   SpO2 97%   BMI 29.13 kg/m    Physical Exam Vitals and nursing note reviewed.  Constitutional:      General: He is not in acute distress.    Appearance: Normal appearance. He is well-developed. He is not diaphoretic.  HENT:     Head: Normocephalic and atraumatic.  Neck:     Thyroid:  No thyromegaly.     Vascular: No JVD.     Trachea: No tracheal deviation.  Cardiovascular:     Rate and Rhythm: Normal rate and regular rhythm.     Heart sounds: Normal heart sounds. No murmur heard.    No friction rub. No gallop.  Pulmonary:     Effort: Pulmonary effort is normal.  Skin:    General: Skin is warm and dry.  Neurological:     Mental Status: He is alert and oriented to person, place, and time.  Psychiatric:        Behavior: Behavior normal.        Thought Content: Thought content normal.        Judgment: Judgment normal.        Assessment/Plan: 1. Left hip  pain (Primary) Will prescribe only short term supply of pain meds as needed. Has appt with pain management soon and also should follow up with ortho for future management--MRI results pending - oxyCODONE (OXY IR/ROXICODONE) 5 MG immediate release tablet; Take 1 tablet (5 mg total) by mouth 2 (two) times daily as needed for up to 7 days for severe pain (pain score 7-10).  Dispense: 14 tablet; Refill: 0   General Counseling: Lamarion verbalizes understanding of the findings of todays visit and agrees with plan of treatment. I have discussed any further diagnostic evaluation that may be needed or ordered today. We also reviewed his medications today. he has been encouraged to call the office with any questions or concerns that should arise related to todays visit.    No orders of the defined types were placed in this encounter.   Meds ordered this encounter  Medications   oxyCODONE (OXY IR/ROXICODONE) 5 MG immediate release tablet    Sig: Take 1 tablet (5 mg total) by mouth 2 (two) times daily as needed for up to 7 days for severe pain (pain score 7-10).    Dispense:  14 tablet    Refill:  0    This patient was seen by Lynn Ito, PA-C in collaboration with Dr. Beverely Risen as a part of collaborative care agreement.   Total time spent:30 Minutes Time spent includes review of chart, medications, test  results, and follow up plan with the patient.      Dr Lyndon Code Internal medicine

## 2023-04-21 ENCOUNTER — Telehealth: Payer: Self-pay

## 2023-04-24 NOTE — Telephone Encounter (Signed)
 Left message for patient to give office a call back.

## 2023-04-25 NOTE — Telephone Encounter (Signed)
 Patient notified

## 2023-05-01 ENCOUNTER — Telehealth: Payer: Self-pay

## 2023-05-01 ENCOUNTER — Encounter: Payer: Medicaid Other | Admitting: Physician Assistant

## 2023-05-01 NOTE — Telephone Encounter (Signed)
 Pt called and canceled his physical today due to pt is moving to [place]

## 2023-05-10 ENCOUNTER — Ambulatory Visit (INDEPENDENT_AMBULATORY_CARE_PROVIDER_SITE_OTHER): Admitting: Orthopedic Surgery

## 2023-05-10 VITALS — BP 140/81 | HR 103 | Ht 67.0 in | Wt 183.0 lb

## 2023-05-10 DIAGNOSIS — M7062 Trochanteric bursitis, left hip: Secondary | ICD-10-CM

## 2023-05-10 DIAGNOSIS — M25552 Pain in left hip: Secondary | ICD-10-CM

## 2023-05-10 DIAGNOSIS — G8929 Other chronic pain: Secondary | ICD-10-CM

## 2023-05-10 DIAGNOSIS — M25452 Effusion, left hip: Secondary | ICD-10-CM | POA: Diagnosis not present

## 2023-05-10 MED ORDER — HYDROCODONE-ACETAMINOPHEN 5-325 MG PO TABS: 1.0000 | ORAL_TABLET | Freq: Four times a day (QID) | ORAL | 0 refills | Status: DC | PRN

## 2023-05-10 NOTE — Progress Notes (Unsigned)
 aspiration

## 2023-05-11 ENCOUNTER — Encounter: Payer: Self-pay | Admitting: Orthopedic Surgery

## 2023-05-12 ENCOUNTER — Other Ambulatory Visit: Payer: Self-pay

## 2023-05-12 DIAGNOSIS — K219 Gastro-esophageal reflux disease without esophagitis: Secondary | ICD-10-CM

## 2023-05-12 MED ORDER — OMEPRAZOLE 20 MG PO CPDR: DELAYED_RELEASE_CAPSULE | ORAL | 1 refills | Status: DC

## 2023-05-21 ENCOUNTER — Emergency Department
Admission: EM | Admit: 2023-05-21 | Discharge: 2023-05-21 | Disposition: A | Discharge status: Home / Self Care | Attending: Emergency Medicine | Admitting: Emergency Medicine

## 2023-05-21 ENCOUNTER — Other Ambulatory Visit: Payer: Self-pay

## 2023-05-21 ENCOUNTER — Emergency Department

## 2023-05-21 DIAGNOSIS — Y939 Activity, unspecified: Secondary | ICD-10-CM | POA: Insufficient documentation

## 2023-05-21 DIAGNOSIS — M7062 Trochanteric bursitis, left hip: Secondary | ICD-10-CM | POA: Diagnosis not present

## 2023-05-21 DIAGNOSIS — N189 Chronic kidney disease, unspecified: Secondary | ICD-10-CM | POA: Insufficient documentation

## 2023-05-21 DIAGNOSIS — M25552 Pain in left hip: Secondary | ICD-10-CM | POA: Diagnosis present

## 2023-05-21 LAB — CBC WITH DIFFERENTIAL/PLATELET
Abs Immature Granulocytes: 0.02 10*3/uL (ref 0.00–0.07)
Basophils Absolute: 0.1 10*3/uL (ref 0.0–0.1)
Basophils Relative: 2 %
Eosinophils Absolute: 0.1 10*3/uL (ref 0.0–0.5)
Eosinophils Relative: 3 %
HCT: 32.1 % — ABNORMAL LOW (ref 39.0–52.0)
Hemoglobin: 9.4 g/dL — ABNORMAL LOW (ref 13.0–17.0)
Immature Granulocytes: 1 %
Lymphocytes Relative: 23 %
Lymphs Abs: 1 10*3/uL (ref 0.7–4.0)
MCH: 24.4 pg — ABNORMAL LOW (ref 26.0–34.0)
MCHC: 29.3 g/dL — ABNORMAL LOW (ref 30.0–36.0)
MCV: 83.4 fL (ref 80.0–100.0)
Monocytes Absolute: 0.8 10*3/uL (ref 0.1–1.0)
Monocytes Relative: 17 %
Neutro Abs: 2.4 10*3/uL (ref 1.7–7.7)
Neutrophils Relative %: 54 %
Platelets: 189 10*3/uL (ref 150–400)
RBC: 3.85 MIL/uL — ABNORMAL LOW (ref 4.22–5.81)
RDW: 20.2 % — ABNORMAL HIGH (ref 11.5–15.5)
WBC: 4.4 10*3/uL (ref 4.0–10.5)
nRBC: 0 % (ref 0.0–0.2)

## 2023-05-21 LAB — BASIC METABOLIC PANEL WITH GFR
Anion gap: 5 (ref 5–15)
BUN: 14 mg/dL (ref 6–20)
CO2: 22 mmol/L (ref 22–32)
Calcium: 8.8 mg/dL — ABNORMAL LOW (ref 8.9–10.3)
Chloride: 108 mmol/L (ref 98–111)
Creatinine, Ser: 0.87 mg/dL (ref 0.61–1.24)
GFR, Estimated: >60 mL/min (ref >60)
Glucose, Bld: 87 mg/dL (ref 70–99)
Potassium: 3.8 mmol/L (ref 3.5–5.1)
Sodium: 135 mmol/L (ref 135–145)

## 2023-05-21 MED ORDER — SODIUM CHLORIDE 0.9 % IV SOLN
1.0000 g | Freq: Once | INTRAVENOUS | Status: AC
  Administered 2023-05-21: 1 g via INTRAVENOUS
  Filled 2023-05-21: qty 10

## 2023-05-21 MED ORDER — OXYCODONE HCL 5 MG PO TABS
10.0000 mg | ORAL_TABLET | Freq: Once | ORAL | Status: AC
  Administered 2023-05-21: 10 mg via ORAL
  Filled 2023-05-21: qty 2

## 2023-05-21 MED ORDER — OXYCODONE HCL 5 MG PO TABS: 5.0000 mg | ORAL_TABLET | Freq: Three times a day (TID) | ORAL | 0 refills | Status: DC | PRN

## 2023-05-21 NOTE — ED Notes (Signed)
 See triage note  Presents with pain to left hip  States he has chronic bursitis to same hip  Recently was on as 3 day bus ride  States pain is worse  And he feels a "hard' knot to that area  Ambulates with slight limp
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
## 2023-05-21 NOTE — Discharge Instructions (Addendum)
 Please ensure that you follow-up with orthopedics and comply with their recommendations.  If you develop a fever or pain continues to increase, return to the emergency department.

## 2023-05-21 NOTE — ED Provider Notes (Signed)
 [facility] Emergency Department Provider Note ____________________________________________  Time seen: Approximately 11:43 AM  I have reviewed the triage vital signs and the nursing notes.   HISTORY  Chief Complaint Hip Pain    HPI Stephen Mayer is a 48 y.o. male with history of CKD, alcoholic cirrhosis, osteonecrosis and as listed in EMR who presents to the emergency department for evaluation and treatment of acute on chronic left sided hip pain. He reports that he has had a recent MRI and is waiting for an appointment to have fluid drained off his hip to see if there is infection. Pain has worsened since traveling on a train to [place] and just arrived back to [place] via bus. Pain is now radiating into his groin. He denies new injury, fever, or extremity weakness.  PHYSICAL EXAM:  VITAL SIGNS: ED Triage Vitals  Encounter Vitals Group     BP 05/21/23 1036 (!) 146/86     Systolic BP Percentile --      Diastolic BP Percentile --      Pulse Rate 05/21/23 1036 (!) 102     Resp 05/21/23 1036 18     Temp 05/21/23 1036 98 F (36.7 C)     Temp src --      SpO2 05/21/23 1036 96 %     Weight 05/21/23 1035 183 lb (83 kg)     Height 05/21/23 1035 5\' 7"  (1.702 m)     Head Circumference --      Peak Flow --      Pain Score 05/21/23 1035 10     Pain Loc --      Pain Education --      Exclude from Growth Chart --     Constitutional: Alert and oriented. Well appearing and in no acute distress. Eyes: Conjunctivae are clear without discharge or drainage Head: Atraumatic Neck: Supple Respiratory: No cough. Respirations are even and unlabored. Musculoskeletal: Tenderness just below surgical scar on left hip. Ambulatory with steady, unassisted gait. Neurologic: Motor and sensory function intact.  Skin: Intact in area of pain of left hip.  Psychiatric: Affect and behavior are appropriate.  ____________________________________________   LABS (all labs ordered are  listed, but only abnormal results are displayed)  Labs Reviewed  BASIC METABOLIC PANEL WITH GFR - Abnormal; Notable for the following components:      Result Value   Calcium 8.8 (*)    All other components within normal limits  CBC WITH DIFFERENTIAL/PLATELET - Abnormal; Notable for the following components:   RBC 3.85 (*)    Hemoglobin 9.4 (*)    HCT 32.1 (*)    MCH 24.4 (*)    MCHC 29.3 (*)    RDW 20.2 (*)    All other components within normal limits  CULTURE, BLOOD (ROUTINE X 2)  CULTURE, BLOOD (ROUTINE X 2)   ____________________________________________  RADIOLOGY  CT of the left hip shows osteolysis and erosive changes along the posterior superior left greater trochanter which corresponds to areas of marrow signal abnormality and cortical breach on the prior MRI and concerning for osteomyelitis. Complex left greater trochanteric left lateral hip soft tissue fluid collection concerning for abscess with surrounding cellulitis.  No soft tissue gas.  Presence of septic arthritis of the left hip cannot be excluded.  I, Kem Boroughs, personally viewed and evaluated these images (plain radiographs) as part of my medical decision making, as well as reviewing the written report by the radiologist.  ____________________________________________   PROCEDURES  Procedures  ____________________________________________   INITIAL IMPRESSION / ASSESSMENT AND PLAN / ED COURSE  Stephen Mayer is a 50 y.o. who presents to the emergency department for treatment and evaluation of worsening chronic left hip pain.  See HPI for further details.  Patient had MRI of the left hip on 04/30/2023.  IR aspiration of fluid collection recommended however patient states that he has not been called for an appointment.  Pain seems to be getting worse.  Outside record review of notes from [facility] orthopedics.  CT today is concerning for abscess with surrounding cellulitis. Case discussed with ED  attending. Plan will be to consult orthopedics and most likely admit. Rocephin ordered.  Discussed cast with Dr. Ernest Pine at length. Plan will be to try and expedite IR aspiration as outpatient. However, at second look, Dr. Dallas Schimke with [facility] was the last to see him.   Discussed with Dr. Steward Drone, on call orthopedist with [facility] who agrees with plan for outpatient follow up for IR aspiration. Change of plan discussed with patient. He was also advised that due to number of different prescribers of narcotics, he will not receive a prescription today.   Differential diagnosis includes, but is not limited to bursitis, septic bursitis, abscess,cellulitis  Patient's presentation is most consistent with exacerbation of chronic illness.    Patient instructed to follow-up with  [facility].  He was also instructed to return to the emergency department for symptoms that change or worsen if unable schedule an appointment with orthopedics or primary care.  Medications  cefTRIAXone (ROCEPHIN) 1 g in sodium chloride 0.9 % 100 mL IVPB (1 g Intravenous New Bag/Given 05/21/23 1502)  oxyCODONE (Oxy IR/ROXICODONE) immediate release tablet 10 mg (10 mg Oral Given 05/21/23 1206)    Pertinent labs & imaging results that were available during my care of the patient were reviewed by me and considered in my medical decision making (see chart for details).   _________________________________________   FINAL CLINICAL IMPRESSION(S) / ED DIAGNOSES  Final diagnoses:  Greater trochanteric bursitis of left hip      If controlled substance prescribed during this visit, 12 month history viewed on the [facility] prior to issuing an initial prescription for Schedule II or III opiod.    Chinita Pester, FNP 05/21/23 1524    Minna Antis, MD 05/21/23 1948

## 2023-05-25 ENCOUNTER — Other Ambulatory Visit: Payer: Self-pay

## 2023-05-25 ENCOUNTER — Telehealth: Payer: Self-pay | Admitting: Physician Assistant

## 2023-05-25 NOTE — Telephone Encounter (Signed)
 Patient called requesting Lauren call [facility] orthopedics to have them move quicker on visit for patient when he was referred to them by hospital. I explained to him we are unable to do that for him and to just be patient, as they have process-Toni

## 2023-05-26 LAB — CULTURE, BLOOD (ROUTINE X 2)
Culture: NO GROWTH
Culture: NO GROWTH

## 2023-05-27 ENCOUNTER — Emergency Department

## 2023-05-27 ENCOUNTER — Other Ambulatory Visit: Payer: Self-pay

## 2023-05-27 ENCOUNTER — Emergency Department
Admission: EM | Admit: 2023-05-27 | Discharge: 2023-05-27 | Disposition: A | Discharge status: Home / Self Care | Attending: Emergency Medicine | Admitting: Emergency Medicine

## 2023-05-27 ENCOUNTER — Encounter: Payer: Self-pay | Admitting: Intensive Care

## 2023-05-27 DIAGNOSIS — R509 Fever, unspecified: Secondary | ICD-10-CM | POA: Insufficient documentation

## 2023-05-27 DIAGNOSIS — R059 Cough, unspecified: Secondary | ICD-10-CM | POA: Insufficient documentation

## 2023-05-27 DIAGNOSIS — M25552 Pain in left hip: Secondary | ICD-10-CM | POA: Diagnosis present

## 2023-05-27 LAB — CBC
HCT: 26 % — ABNORMAL LOW (ref 39.0–52.0)
Hemoglobin: 8.2 g/dL — ABNORMAL LOW (ref 13.0–17.0)
MCH: 25 pg — ABNORMAL LOW (ref 26.0–34.0)
MCHC: 31.5 g/dL (ref 30.0–36.0)
MCV: 79.3 fL — ABNORMAL LOW (ref 80.0–100.0)
Platelets: 144 10*3/uL — ABNORMAL LOW (ref 150–400)
RBC: 3.28 MIL/uL — ABNORMAL LOW (ref 4.22–5.81)
RDW: 19.8 % — ABNORMAL HIGH (ref 11.5–15.5)
WBC: 1.7 10*3/uL — ABNORMAL LOW (ref 4.0–10.5)
nRBC: 0 % (ref 0.0–0.2)

## 2023-05-27 LAB — COMPREHENSIVE METABOLIC PANEL WITH GFR
ALT: 54 U/L — ABNORMAL HIGH (ref 0–44)
AST: 170 U/L — ABNORMAL HIGH (ref 15–41)
Albumin: 2.3 g/dL — ABNORMAL LOW (ref 3.5–5.0)
Alkaline Phosphatase: 141 U/L — ABNORMAL HIGH (ref 38–126)
Anion gap: 7 (ref 5–15)
BUN: 8 mg/dL (ref 6–20)
CO2: 18 mmol/L — ABNORMAL LOW (ref 22–32)
Calcium: 7.6 mg/dL — ABNORMAL LOW (ref 8.9–10.3)
Chloride: 107 mmol/L (ref 98–111)
Creatinine, Ser: 0.75 mg/dL (ref 0.61–1.24)
GFR, Estimated: >60 mL/min (ref >60)
Glucose, Bld: 87 mg/dL (ref 70–99)
Potassium: 2.9 mmol/L — ABNORMAL LOW (ref 3.5–5.1)
Sodium: 132 mmol/L — ABNORMAL LOW (ref 135–145)
Total Bilirubin: 1.6 mg/dL — ABNORMAL HIGH (ref 0.0–1.2)
Total Protein: 7.2 g/dL (ref 6.5–8.1)

## 2023-05-27 LAB — CBC WITH DIFFERENTIAL/PLATELET
Abs Immature Granulocytes: 0.01 10*3/uL (ref 0.00–0.07)
Basophils Absolute: 0 10*3/uL (ref 0.0–0.1)
Basophils Relative: 1 %
Eosinophils Absolute: 0 10*3/uL (ref 0.0–0.5)
Eosinophils Relative: 1 %
HCT: 25.8 % — ABNORMAL LOW (ref 39.0–52.0)
Hemoglobin: 8.2 g/dL — ABNORMAL LOW (ref 13.0–17.0)
Immature Granulocytes: 1 %
Lymphocytes Relative: 25 %
Lymphs Abs: 0.4 10*3/uL — ABNORMAL LOW (ref 0.7–4.0)
MCH: 25.2 pg — ABNORMAL LOW (ref 26.0–34.0)
MCHC: 31.8 g/dL (ref 30.0–36.0)
MCV: 79.4 fL — ABNORMAL LOW (ref 80.0–100.0)
Monocytes Absolute: 0.4 10*3/uL (ref 0.1–1.0)
Monocytes Relative: 24 %
Neutro Abs: 0.9 10*3/uL — ABNORMAL LOW (ref 1.7–7.7)
Neutrophils Relative %: 48 %
Platelets: 147 10*3/uL — ABNORMAL LOW (ref 150–400)
RBC: 3.25 MIL/uL — ABNORMAL LOW (ref 4.22–5.81)
RDW: 19.9 % — ABNORMAL HIGH (ref 11.5–15.5)
Smear Review: NORMAL
WBC: 1.8 10*3/uL — ABNORMAL LOW (ref 4.0–10.5)
nRBC: 0 % (ref 0.0–0.2)

## 2023-05-27 MED ORDER — OXYCODONE HCL 5 MG PO TABS: 10.0000 mg | ORAL_TABLET | Freq: Three times a day (TID) | ORAL | 0 refills | Status: DC | PRN

## 2023-05-27 MED ORDER — IPRATROPIUM-ALBUTEROL 0.5-2.5 (3) MG/3ML IN SOLN
3.0000 mL | Freq: Once | RESPIRATORY_TRACT | Status: AC
  Administered 2023-05-27: 3 mL via RESPIRATORY_TRACT
  Filled 2023-05-27: qty 3

## 2023-05-27 MED ORDER — MORPHINE SULFATE (PF) 4 MG/ML IV SOLN
4.0000 mg | Freq: Once | INTRAVENOUS | Status: AC
  Administered 2023-05-27: 4 mg via INTRAVENOUS
  Filled 2023-05-27: qty 1

## 2023-05-27 MED ORDER — ONDANSETRON HCL 4 MG/2ML IJ SOLN
4.0000 mg | Freq: Once | INTRAMUSCULAR | Status: AC
  Administered 2023-05-27: 4 mg via INTRAVENOUS
  Filled 2023-05-27: qty 2

## 2023-05-27 NOTE — ED Triage Notes (Signed)
 Patient presents with continuing left hip pain. Had MRI in February 2025 and seen here 6 days ago and had CT hip. Patient reports he is suppose to have fluid drawn off of hip but no one has contacted him or done that yet.  Has noted bilateral leg/ankle swelling.   Also reports cough and fever

## 2023-05-27 NOTE — Discharge Instructions (Signed)
 As we discussed, we have concern for chronic infection in your left hip that requires specialty care

## 2023-05-27 NOTE — ED Provider Notes (Signed)
 [facility] Provider Note    Event Date/Time   First MD Initiated Contact with Patient 05/27/23 1021     (approximate)   History   Fever, Cough, and Hip Pain   HPI  Stephen Mayer is a 48 y.o. male with a history of liver failure, who presents with cough and fever over the last 2 days.  He also reports continued severe left hip pain.  He is seen multiple orthopedists about this, review of MRI demonstrates concern for possible osteo-, was supposed to have IR aspiration of fluid but this has not happened, he is very frustrated by this.     Physical Exam   Triage Vital Signs: ED Triage Vitals  Encounter Vitals Group     BP 05/27/23 1014 128/75     Systolic BP Percentile --      Diastolic BP Percentile --      Pulse Rate 05/27/23 1014 97     Resp 05/27/23 1014 18     Temp 05/27/23 1014 98.6 F (37 C)     Temp Source 05/27/23 1014 Oral     SpO2 05/27/23 1014 98 %     Weight 05/27/23 1011 84.4 kg (186 lb)     Height 05/27/23 1011 1.702 m (5\' 7" )     Head Circumference --      Peak Flow --      Pain Score 05/27/23 1011 10     Pain Loc --      Pain Education --      Exclude from Growth Chart --     Most recent vital signs: Vitals:   05/27/23 1014 05/27/23 1345  BP: 128/75 128/75  Pulse: 97 92  Resp: 18 16  Temp: 98.6 F (37 C) 98.7 F (37.1 C)  SpO2: 98% 99%     General: Awake, no distress.  CV:  Good peripheral perfusion.  Resp:  Normal effort.  No wheezing Abd:  No distention.  Other:  Mild edema bilaterally, he reports this is chronic   ED Results / Procedures / Treatments   Labs (all labs ordered are listed, but only abnormal results are displayed) Labs Reviewed  CBC - Abnormal; Notable for the following components:      Result Value   WBC 1.7 (*)    RBC 3.28 (*)    Hemoglobin 8.2 (*)    HCT 26.0 (*)    MCV 79.3 (*)    MCH 25.0 (*)    RDW 19.8 (*)    Platelets 144 (*)    All other components within normal limits   COMPREHENSIVE METABOLIC PANEL WITH GFR - Abnormal; Notable for the following components:   Sodium 132 (*)    Potassium 2.9 (*)    CO2 18 (*)    Calcium 7.6 (*)    Albumin 2.3 (*)    AST 170 (*)    ALT 54 (*)    Alkaline Phosphatase 141 (*)    Total Bilirubin 1.6 (*)    All other components within normal limits  CBC WITH DIFFERENTIAL/PLATELET - Abnormal; Notable for the following components:   WBC 1.8 (*)    RBC 3.25 (*)    Hemoglobin 8.2 (*)    HCT 25.8 (*)    MCV 79.4 (*)    MCH 25.2 (*)    RDW 19.9 (*)    Platelets 147 (*)    Neutro Abs 0.9 (*)    Lymphs Abs 0.4 (*)    All  other components within normal limits     EKG     RADIOLOGY     PROCEDURES:  Critical Care performed:   Procedures   MEDICATIONS ORDERED IN ED: Medications  morphine (PF) 4 MG/ML injection 4 mg (4 mg Intravenous Given 05/27/23 1131)  ondansetron (ZOFRAN) injection 4 mg (4 mg Intravenous Given 05/27/23 1131)  ipratropium-albuterol (DUONEB) 0.5-2.5 (3) MG/3ML nebulizer solution 3 mL (3 mLs Nebulization Given 05/27/23 1320)  ipratropium-albuterol (DUONEB) 0.5-2.5 (3) MG/3ML nebulizer solution 3 mL (3 mLs Nebulization Given 05/27/23 1320)     IMPRESSION / MDM / ASSESSMENT AND PLAN / ED COURSE  I reviewed the triage vital signs and the nursing notes. Patient's presentation is most consistent with severe exacerbation of chronic illness.  Patient with cough fever as above, differential includes URI, pneumonia  Also with continued worsening pain in the left hip, concern for osteo on prior MRI possible abscess does need to have aspiration of this, apparently this has been arranged as an outpatient  Will obtain labs here, chest x-ray  Chest x-ray is reassuring.  Lab work demonstrates white blood cell count of 1.8, have added on a differential.  He has chronically low white blood cells likely related to his liver disease.  He is afebrile, well-appearing, not consistent with sepsis  Discussed  with Dr. Annabell Key and Dr. Clyda Dark of orthopedics, they both note that patient has been instructed by multiple orthopedist that he needs tertiary level outpatient management for his chronic condition but has refused to do so  They feel that he is appropriate for discharge with outpatient follow-up  I discussed at length with patient the need for tertiary care and recommended transfer to [facility] today, especially given his labs however he declined, he states he would like to follow-up as an outpatient  He knows how important close follow-up is for this complex issue and understands that given declining transfer symptoms could worsen he could develop a significant life-threatening infection        FINAL CLINICAL IMPRESSION(S) / ED DIAGNOSES   Final diagnoses:  Left hip pain     Rx / DC Orders   ED Discharge Orders          Ordered    oxyCODONE (ROXICODONE) 5 MG immediate release tablet  Every 8 hours PRN        05/27/23 1321             Note:  This document was prepared using Dragon voice recognition software and may include unintentional dictation errors.   Bryson Carbine, MD 05/27/23 [phone]

## 2023-05-29 ENCOUNTER — Ambulatory Visit: Admitting: Physician Assistant

## 2023-06-01 ENCOUNTER — Telehealth: Payer: Self-pay | Admitting: Radiology

## 2023-06-01 NOTE — Telephone Encounter (Signed)
 I called patient after receiving a referral for him to be seen again after an ED visit.  Referral was to you.   He has been speaking with [facility] about a visit there, where he will have to [place] back and forth.  He went to the ED this past Saturday and the ED provider offered to transfer him to [facility], but he would have had no way home if they did not admit him and take care of it.  He has some cash this weekend and Danyelle Brookover go back to the ED.  Asked me if I thought he should as he has more squishy feeling and feels it inside moving around.  He does not have any drainage outside.  He does have intermittent fevers chills.  I told him if he worsened and thought he needed to go to the ED then to go.  He is supposed to receive a call back from [facility] soon he hopes, about appt.  Please advise on anything to tell him?  Is the referral to [facility] best we can offer at this time?  Thanks.

## 2023-06-05 ENCOUNTER — Encounter: Payer: Self-pay | Admitting: Physician Assistant

## 2023-06-05 ENCOUNTER — Ambulatory Visit: Admitting: Physician Assistant

## 2023-06-05 VITALS — BP 132/81 | HR 82 | Temp 97.8°F | Resp 16 | Ht 67.0 in | Wt 181.0 lb

## 2023-06-05 DIAGNOSIS — L02416 Cutaneous abscess of left lower limb: Secondary | ICD-10-CM | POA: Diagnosis not present

## 2023-06-05 DIAGNOSIS — L03116 Cellulitis of left lower limb: Secondary | ICD-10-CM

## 2023-06-05 DIAGNOSIS — M25552 Pain in left hip: Secondary | ICD-10-CM | POA: Diagnosis not present

## 2023-06-05 MED ORDER — OXYCODONE HCL 5 MG PO TABS: 5.0000 mg | ORAL_TABLET | Freq: Two times a day (BID) | ORAL | 0 refills | Status: DC | PRN

## 2023-06-05 MED ORDER — DOXYCYCLINE HYCLATE 100 MG PO TABS: 100.0000 mg | ORAL_TABLET | Freq: Two times a day (BID) | ORAL | 0 refills | Status: DC

## 2023-06-05 NOTE — Progress Notes (Signed)
 [facility] [address] [place], [place] [address]  Internal MEDICINE  Office Visit Note  Patient Name: Stephen Mayer  [id number]  [id number]  Date of Service: 06/18/2023  Chief Complaint  Patient presents with   Follow-up    ED    HPI Pt is here for routine follow up -Was planning to move to [place], but didn't work out and came back to [facility] to ED with hip pain. Given IV ABX and discharged. He went back the next week. Ortho consulted and was told he needs to go to tertiary center like [facility] for management of fluid on hip as local provider cannot do this -last week contacted [facility] and was told he should hear from them for an appt to address hip. States he should hear from them this week -was given a round of doxycyline for 5 days -Abscess present on left hip in office today. Advised to go to ED but states he has been twice and nothing has been done locally. Advised on going to [facility] [place] if [facility] provider recommended, but states transportation and finances problematic and does not have a ride or money to pay for it right now. -some wheezing, improving some.   Current Medication: Outpatient Encounter Medications as of 06/05/2023  Medication Sig   hydrochlorothiazide  (HYDRODIURIL ) 12.5 MG tablet Take 1 tablet (12.5 mg total) by mouth daily.   meloxicam  (MOBIC ) 15 MG tablet Take 15 mg by mouth daily.   omeprazole  (PRILOSEC) 20 MG capsule Take 1 capsule by mouth daily   [DISCONTINUED] doxycycline  (VIBRA -TABS) 100 MG tablet Take 1 tablet (100 mg total) by mouth 2 (two) times daily.   [DISCONTINUED] oxyCODONE  (ROXICODONE ) 5 MG immediate release tablet Take 2 tablets (10 mg total) by mouth every 8 (eight) hours as needed for severe pain (pain score 7-10).   famotidine  (PEPCID ) 20 MG tablet Take 1 tablet (20 mg total) by mouth 2 (two) times daily for 14 days.   Multiple Vitamin (MULTIVITAMIN WITH MINERALS) TABS tablet Take 1 tablet by mouth daily. (Patient not taking: Reported  on 06/05/2023)   tamsulosin (FLOMAX) 0.4 MG CAPS capsule Take 0.4 mg by mouth daily. (Patient not taking: Reported on 06/05/2023)   [DISCONTINUED] ondansetron  (ZOFRAN -ODT) 8 MG disintegrating tablet Take 1 tablet (8 mg total) by mouth every 8 (eight) hours as needed for nausea or vomiting. (Patient not taking: Reported on 05/10/2023)   [DISCONTINUED] oxyCODONE  (ROXICODONE ) 5 MG immediate release tablet Take 1 tablet (5 mg total) by mouth 2 (two) times daily as needed for severe pain (pain score 7-10).   [DISCONTINUED] tiZANidine  (ZANAFLEX ) 4 MG tablet Take 1 tablet (4 mg total) by mouth 3 (three) times daily. (Patient not taking: Reported on 05/10/2023)   No facility-administered encounter medications on file as of 06/05/2023.    Surgical History: Past Surgical History:  Procedure Laterality Date   ESOPHAGOGASTRODUODENOSCOPY (EGD) WITH PROPOFOL  N/A 08/27/2022   Procedure: ESOPHAGOGASTRODUODENOSCOPY (EGD) WITH PROPOFOL ;  Surgeon: Shane Darling, MD;  Location: [facility] ENDOSCOPY;  Service: Endoscopy;  Laterality: N/A;   REPLACEMENT TOTAL KNEE Bilateral 2021   TOTAL HIP ARTHROPLASTY Bilateral 2021    Medical History: Past Medical History:  Diagnosis Date   Arthritis    Bursitis    L hip   CKD (chronic kidney disease)    Hematemesis with nausea 08/25/2022   Liver failure (HCC)    Osteonecrosis (HCC)    "all my joints"    Family History: Family History  Problem Relation Age of Onset  Kidney disease Mother    Diabetes Mother    Diabetes Brother     Social History   Socioeconomic History   Marital status: Single    Spouse name: Not on file   Number of children: Not on file   Years of education: Not on file   Highest education level: Not on file  Occupational History   Not on file  Tobacco Use   Smoking status: Every Day    Current packs/day: 0.50    Types: Cigarettes   Smokeless tobacco: Never   Tobacco comments:    3 cigarettes a day   Vaping Use   Vaping status:  Never Used  Substance and Sexual Activity   Alcohol use: Yes    Alcohol/week: 2.0 standard drinks of alcohol    Types: 2 Cans of beer per week    Comment: occ   Drug use: Not Currently   Sexual activity: Yes  Other Topics Concern   Not on file  Social History Narrative   Not on file   Social Drivers of Health   Financial Resource Strain: Medium Risk (03/29/2023)   Received from [facility]   Overall Financial Resource Strain (CARDIA)    Difficulty of Paying Living Expenses: Somewhat hard  Food Insecurity: Food Insecurity Present (03/29/2023)   Received from [facility]   Hunger Vital Sign    Worried About Running Out of Food in the Last Year: Sometimes true    Ran Out of Food in the Last Year: Sometimes true  Transportation Needs: Unmet Transportation Needs (03/29/2023)   Received from [facility]   PRAPARE - Transportation    In the past 12 months, has lack of transportation kept you from medical appointments or from getting medications?: Yes    Lack of Transportation (Non-Medical): Yes  Physical Activity: Not on file  Stress: Not on file  Social Connections: Not on file  Intimate Partner Violence: Not At Risk (01/12/2023)   Humiliation, Afraid, Rape, and Kick questionnaire    Fear of Current or Ex-Partner: No    Emotionally Abused: No    Physically Abused: No    Sexually Abused: No      Review of Systems  Constitutional:  Negative for chills, fatigue and unexpected weight change.  HENT:  Negative for congestion, postnasal drip, rhinorrhea, sneezing and sore throat.   Eyes:  Negative for redness.  Respiratory:  Negative for cough, chest tightness and shortness of breath.   Cardiovascular:  Negative for chest pain and palpitations.  Gastrointestinal:  Negative for constipation, diarrhea, nausea and vomiting.  Genitourinary:  Negative for frequency.  Musculoskeletal:  Positive for arthralgias.  Skin:  Positive for  color change and wound.  Neurological:  Positive for numbness. Negative for tremors.  Hematological:  Negative for adenopathy. Does not bruise/bleed easily.  Psychiatric/Behavioral:  Negative for behavioral problems (Depression), sleep disturbance and suicidal ideas.     Vital Signs: BP 132/81   Pulse 82   Temp 97.8 F (36.6 C)   Resp 16   Ht 5\' 7"  (1.702 m)   Wt 181 lb (82.1 kg)   SpO2 97%   BMI 28.35 kg/m    Physical Exam Vitals and nursing note reviewed.  Constitutional:      General: He is not in acute distress.    Appearance: Normal appearance. He is well-developed. He is not diaphoretic.  HENT:     Head: Normocephalic and atraumatic.  Neck:     Thyroid:  No thyromegaly.     Vascular: No JVD.     Trachea: No tracheal deviation.  Cardiovascular:     Rate and Rhythm: Normal rate and regular rhythm.     Heart sounds: Normal heart sounds. No murmur heard.    No friction rub. No gallop.  Pulmonary:     Effort: Pulmonary effort is normal.  Skin:    General: Skin is warm and dry.     Findings: Erythema present.     Comments: Large abscess present along left hip/bursitis, tender to palpation  Neurological:     Mental Status: He is alert and oriented to person, place, and time.  Psychiatric:        Behavior: Behavior normal.        Thought Content: Thought content normal.        Judgment: Judgment normal.        Assessment/Plan: 1. Cellulitis and abscess of left leg (Primary) Will extend doxycycline , but advised to call [facility] for appt and to go back to ED for further management. He will monitor closely for progression of infection  2. Left hip pain Will establish with [facility] ortho. Short course pain meds given    General Counseling: Tayt verbalizes understanding of the findings of todays visit and agrees with plan of treatment. I have discussed any further diagnostic evaluation that may be needed or ordered today. We also reviewed his medications today. he has  been encouraged to call the office with any questions or concerns that should arise related to todays visit.    No orders of the defined types were placed in this encounter.   Meds ordered this encounter  Medications   DISCONTD: doxycycline  (VIBRA -TABS) 100 MG tablet    Sig: Take 1 tablet (100 mg total) by mouth 2 (two) times daily.    Dispense:  20 tablet    Refill:  0   DISCONTD: oxyCODONE  (ROXICODONE ) 5 MG immediate release tablet    Sig: Take 1 tablet (5 mg total) by mouth 2 (two) times daily as needed for severe pain (pain score 7-10).    Dispense:  20 tablet    Refill:  0    This patient was seen by Taylor Favia, PA-C in collaboration with Dr. Verneta Gone as a part of collaborative care agreement.   Total time spent:30 Minutes Time spent includes review of chart, medications, test results, and follow up plan with the patient.      Dr Fozia M Khan Internal medicine

## 2023-06-06 ENCOUNTER — Emergency Department
Admission: EM | Admit: 2023-06-06 | Discharge: 2023-06-06 | Disposition: A | Discharge status: Home / Self Care | Attending: Emergency Medicine | Admitting: Emergency Medicine

## 2023-06-06 ENCOUNTER — Encounter: Payer: Self-pay | Admitting: Emergency Medicine

## 2023-06-06 ENCOUNTER — Other Ambulatory Visit: Payer: Self-pay

## 2023-06-06 DIAGNOSIS — M25552 Pain in left hip: Secondary | ICD-10-CM | POA: Diagnosis not present

## 2023-06-06 DIAGNOSIS — M25559 Pain in unspecified hip: Secondary | ICD-10-CM | POA: Diagnosis present

## 2023-06-06 MED ORDER — CEPHALEXIN 500 MG PO CAPS: 500.0000 mg | ORAL_CAPSULE | Freq: Three times a day (TID) | ORAL | 0 refills | Status: AC

## 2023-06-06 MED ORDER — SULFAMETHOXAZOLE-TRIMETHOPRIM 800-160 MG PO TABS
1.0000 | ORAL_TABLET | Freq: Once | ORAL | Status: AC
Start: 2023-06-06 — End: 2023-06-06
  Administered 2023-06-06: 1 via ORAL
  Filled 2023-06-06: qty 1

## 2023-06-06 MED ORDER — OXYCODONE HCL 5 MG PO TABS
10.0000 mg | ORAL_TABLET | Freq: Once | ORAL | Status: AC
  Administered 2023-06-06: 10 mg via ORAL
  Filled 2023-06-06: qty 2

## 2023-06-06 MED ORDER — SULFAMETHOXAZOLE-TRIMETHOPRIM 800-160 MG PO TABS: 1.0000 | ORAL_TABLET | Freq: Two times a day (BID) | ORAL | 0 refills | Status: DC

## 2023-06-06 MED ORDER — CEPHALEXIN 500 MG PO CAPS
500.0000 mg | ORAL_CAPSULE | Freq: Once | ORAL | Status: AC
  Administered 2023-06-06: 500 mg via ORAL
  Filled 2023-06-06: qty 1

## 2023-06-06 NOTE — ED Provider Notes (Signed)
 [facility] Provider Note    Event Date/Time   First MD Initiated Contact with Patient 06/06/23 [phone]     (approximate)   History   Hip Pain   HPI  Stephen Mayer is a 48 y.o. male history of arthritis, liver failure, osteonecrosis presents emergency department with his bursitis wound draining.  Patient has had MRIs and extensive workup and has been told to follow-up with [facility] due to needing a more advanced specialist.  He had taken 5 days of doxycycline .  His doctor was going to run him on another course of doxycycline .  No wound culture has been performed.  He denies fever or chills.  Just has pain in the area.      Physical Exam   Triage Vital Signs: ED Triage Vitals  Encounter Vitals Group     BP 06/06/23 0706 (!) 138/97     Systolic BP Percentile --      Diastolic BP Percentile --      Pulse Rate 06/06/23 0706 (!) 104     Resp 06/06/23 0706 17     Temp 06/06/23 0706 98.6 F (37 C)     Temp Source 06/06/23 0706 Oral     SpO2 06/06/23 0706 99 %     Weight 06/06/23 0707 181 lb (82.1 kg)     Height 06/06/23 0707 5\' 7"  (1.702 m)     Head Circumference --      Peak Flow --      Pain Score 06/06/23 0707 10     Pain Loc --      Pain Education --      Exclude from Growth Chart --     Most recent vital signs: Vitals:   06/06/23 0706  BP: (!) 138/97  Pulse: (!) 104  Resp: 17  Temp: 98.6 F (37 C)  SpO2: 99%     General: Awake, no distress.   CV:  Good peripheral perfusion. regular rate and  rhythm Resp:  Normal effort.  Abd:  No distention.   Other:  Left hip with raised red draining area, drainage is a clearish yellow.   ED Results / Procedures / Treatments   Labs (all labs ordered are listed, but only abnormal results are displayed) Labs Reviewed  AEROBIC/ANAEROBIC CULTURE W GRAM STAIN (SURGICAL/DEEP WOUND)     EKG     RADIOLOGY     PROCEDURES:   Procedures Chief Complaint  Patient presents with   Hip Pain       MEDICATIONS ORDERED IN ED: Medications  cephALEXin  (KEFLEX ) capsule 500 mg (has no administration in time range)  sulfamethoxazole -trimethoprim  (BACTRIM  DS) 800-160 MG per tablet 1 tablet (has no administration in time range)  oxyCODONE  (Oxy IR/ROXICODONE ) immediate release tablet 10 mg (has no administration in time range)     IMPRESSION / MDM / ASSESSMENT AND PLAN / ED COURSE  I reviewed the triage vital signs and the nursing notes.                              Differential diagnosis includes, but is not limited to, abscess, wound infection, cellulitis  Patient's presentation is most consistent with acute illness / injury with system symptoms.   Tends to be a chronic problem for him.  He has had multiple images/MRIs.  Does have an appointment scheduled with [facility] and has gotten in contact with him.  At this time due to the complexity  of the situation I do not feel that an incision and drainage here in our emergency department is the correct treatment.  Will go ahead and change his antibiotic to Keflex  and Bactrim  as the doxycycline  did not appear to help.  Wound culture was sent to the lab.  He is to follow-up with [facility].  Return if worsening.  He was discharged stable condition.      FINAL CLINICAL IMPRESSION(S) / ED DIAGNOSES   Final diagnoses:  Left hip pain     Rx / DC Orders   ED Discharge Orders          Ordered    cephALEXin  (KEFLEX ) 500 MG capsule  3 times daily        06/06/23 0825    sulfamethoxazole -trimethoprim  (BACTRIM  DS) 800-160 MG tablet  2 times daily        06/06/23 0825             Note:  This document was prepared using Dragon voice recognition software and may include unintentional dictation errors.    Delsie Figures, PA-C 06/06/23 0830    Bradler, Evan K, MD 06/08/23 [phone]

## 2023-06-06 NOTE — ED Notes (Signed)
 See triage note  Presents with left hip pain  Denies any injury  Hx of bursitis to that hip

## 2023-06-06 NOTE — ED Triage Notes (Signed)
 Patient to ED via POV for left hip pain. 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 Having drainage from site since. Suppose to f/u with [facility] orthopedic but waiting on an appointment.

## 2023-06-11 ENCOUNTER — Other Ambulatory Visit: Payer: Self-pay

## 2023-06-11 ENCOUNTER — Emergency Department
Admission: EM | Admit: 2023-06-11 | Discharge: 2023-06-11 | Disposition: A | Discharge status: Home / Self Care | Attending: Emergency Medicine | Admitting: Emergency Medicine

## 2023-06-11 DIAGNOSIS — Z96643 Presence of artificial hip joint, bilateral: Secondary | ICD-10-CM | POA: Insufficient documentation

## 2023-06-11 DIAGNOSIS — N189 Chronic kidney disease, unspecified: Secondary | ICD-10-CM | POA: Diagnosis not present

## 2023-06-11 DIAGNOSIS — Z96653 Presence of artificial knee joint, bilateral: Secondary | ICD-10-CM | POA: Insufficient documentation

## 2023-06-11 DIAGNOSIS — M25552 Pain in left hip: Secondary | ICD-10-CM

## 2023-06-11 DIAGNOSIS — M71052 Abscess of bursa, left hip: Secondary | ICD-10-CM | POA: Insufficient documentation

## 2023-06-11 DIAGNOSIS — L0291 Cutaneous abscess, unspecified: Secondary | ICD-10-CM

## 2023-06-11 HISTORY — DX: Bursopathy, unspecified: M71.9

## 2023-06-11 LAB — AEROBIC/ANAEROBIC CULTURE W GRAM STAIN (SURGICAL/DEEP WOUND): Culture: NO GROWTH

## 2023-06-11 MED ORDER — LIDOCAINE HCL (PF) 1 % IJ SOLN
5.0000 mL | Freq: Once | INTRAMUSCULAR | Status: AC
  Administered 2023-06-11: 5 mL
  Filled 2023-06-11: qty 5

## 2023-06-11 MED ORDER — OXYCODONE HCL 5 MG PO TABS
10.0000 mg | ORAL_TABLET | Freq: Once | ORAL | Status: AC
  Administered 2023-06-11: 10 mg via ORAL
  Filled 2023-06-11: qty 2

## 2023-06-11 MED ORDER — IBUPROFEN 600 MG PO TABS: 600.0000 mg | ORAL_TABLET | Freq: Three times a day (TID) | ORAL | 0 refills | Status: DC | PRN

## 2023-06-11 MED ORDER — OXYCODONE HCL 5 MG PO TABS: 5.0000 mg | ORAL_TABLET | Freq: Three times a day (TID) | ORAL | 0 refills | Status: DC | PRN

## 2023-06-11 NOTE — ED Triage Notes (Signed)
 Pt to ED with acute on chronic L hip pain since this morning. Hx bursitis to L hip. States took shower this AM and noticed "something raw coming out of my hip" and "green pus" coming out. Pt is ambulatory with steady gait.

## 2023-06-11 NOTE — Discharge Instructions (Addendum)
 You have been diagnosed with abscess in the left hip, left hip pain.  Please take doxycycline  1 tablet every 12 hours after main meals.  Take ibuprofen  every 8 hours for pain.  Please call to make an appointment with the wound center to recheck the drainage.  Please go to your appointment with orthopedics on Jul 05, 2023.  You have new symptoms or symptoms worsen please come back to ED or go to your PCP

## 2023-06-11 NOTE — ED Provider Notes (Addendum)
 [facility] Provider Note    Event Date/Time   First MD Initiated Contact with Patient 06/11/23 1529     (approximate)   History   Hip Pain and Wound Check    HPI  Stephen Mayer is a 48 y.o. male      with a past medical history of left hip bursitis, alcohol induced pancreatitis, GERD, alcoholic cirrhosis, bilateral total hip replacement, bilateral knee replacement, osteonecrosis, m chronic kidney disease, arthritis, who presents to the ED complaining of drainage on left hip in the last 5 days.  Patient was seen on April 21/2025.  According to the patient he was diagnosed with cellulitis, referred him to [facility] for drainage of fluid in the left hip.  Patient received a prescription for Bactrim  but he was unable to buy it.  Patient has insurance but he needs to wait for his paycheck to buy  medications.  Patient states he has an appointment with [facility] on May 21 with orthopedics.      Physical Exam   Triage Vital Signs: ED Triage Vitals  Encounter Vitals Group     BP 06/11/23 1526 138/78     Systolic BP Percentile --      Diastolic BP Percentile --      Pulse Rate 06/11/23 1526 90     Resp 06/11/23 1526 16     Temp 06/11/23 1526 97.8 F (36.6 C)     Temp Source 06/11/23 1526 Oral     SpO2 06/11/23 1526 100 %     Weight 06/11/23 1524 179 lb (81.2 kg)     Height 06/11/23 1524 5\' 7"  (1.702 m)     Head Circumference --      Peak Flow --      Pain Score 06/11/23 1523 10     Pain Loc --      Pain Education --      Exclude from Growth Chart --     Most recent vital signs: Vitals:   06/11/23 1526  BP: 138/78  Pulse: 90  Resp: 16  Temp: 97.8 F (36.6 C)  SpO2: 100%     Constitutional: Alert, NAD. Able to speak in complete sentences without cough or dyspnea  Eyes: Conjunctivae are normal.  Head: Atraumatic. Nose: No congestion/rhinnorhea. Mouth/Throat: Mucous membranes are moist.   Neck: Painless ROM. Supple. No JVD, nodes,  thyromegaly  Cardiovascular:   Good peripheral circulation.RRR no murmurs, gallops, rubs  Respiratory: Normal respiratory effort.  No retractions. Clear to auscultation bilaterally without wheezing or crackles  Gastrointestinal: Soft and nontender.  Musculoskeletal:  no deformity Left hip: Presence of protrusion about 4 cm, with yellow drainage, tender to palpation, no warmth. Neurologic:  MAE spontaneously. No gross focal neurologic deficits are appreciated.  Skin:  Skin is warm, dry and intact. No rash noted. Psychiatric: Mood and affect are normal. Speech and behavior are normal.    ED Results / Procedures / Treatments   Labs (all labs ordered are listed, but only abnormal results are displayed) Labs Reviewed - No data to display   EKG     RADIOLOGY      PROCEDURES:  Critical Care performed:   .Incision and Drainage  Date/Time: 06/11/2023 4:49 PM  Performed by: Awilda Lennox, PA-C Authorized by: Awilda Lennox, PA-C   Consent:    Consent obtained:  Verbal   Consent given by:  Patient   Risks discussed:  Incomplete drainage Universal protocol:    Procedure explained and questions  answered to patient or proxy's satisfaction: yes     Patient identity confirmed:  Verbally with patient Location:    Type:  Abscess   Location:  Lower extremity   Lower extremity location:  Hip   Hip location:  L hip Pre-procedure details:    Skin preparation:  Antiseptic wash Sedation:    Sedation type:  None Anesthesia:    Anesthesia method:  Local infiltration   Local anesthetic:  Lidocaine  1% WITH epi Procedure type:    Complexity:  Simple Procedure details:    Ultrasound guidance: no     Needle aspiration: no     Incision types:  Stab incision   Wound management:  Irrigated with saline and probed and deloculated   Drainage:  Serosanguinous and purulent   Wound treatment:  Drain placed Post-procedure details:    Procedure completion:  Tolerated well, no immediate  complications    MEDICATIONS ORDERED IN ED: Medications  oxyCODONE  (Oxy IR/ROXICODONE ) immediate release tablet 10 mg (10 mg Oral Given 06/11/23 1618)  lidocaine  (PF) (XYLOCAINE ) 1 % injection 5 mL (5 mLs Other Given 06/11/23 1619)      IMPRESSION / MDM / ASSESSMENT AND PLAN / ED COURSE  I reviewed the triage vital signs and the nursing notes.  Differential diagnosis includes, but is not limited to, abscess, cellulitis, bursitis, foreign body  Patient's presentation is most consistent with acute, uncomplicated illness.   Patient's diagnosis is consistent with left hip abscess.  I did review the patient's allergies and medications.The patient is in stable and satisfactory condition for discharge home.  Patient states has doxycycline  at home, 6 days treatment.  Patient will be discharged home with prescriptions for Oxycodone . Patient  unable to take NSAID's, or acetaminophen  due to kidney and renal failure. Patient is to follow up with orthopedics, wound center as needed or otherwise directed. Patient is given ED precautions to return to the ED for any worsening or new symptoms. Discussed plan of care with patient, answered all of patient's questions, Patient agreeable to plan of care. Advised patient to take medications according to the instructions on the label. Discussed possible side effects of new medications. Patient verbalized understanding.    FINAL CLINICAL IMPRESSION(S) / ED DIAGNOSES   Final diagnoses:  Abscess  Left hip pain     Rx / DC Orders   ED Discharge Orders          Ordered    ibuprofen  (ADVIL ) 600 MG tablet  Every 8 hours PRN,   Status:  Discontinued        06/11/23 1654    oxyCODONE  (ROXICODONE ) 5 MG immediate release tablet  Every 8 hours PRN        06/11/23 1702             Note:  This document was prepared using Dragon voice recognition software and may include unintentional dictation errors.   Awilda Lennox, PA-C 06/11/23 1658    Awilda Lennox, PA-C 06/11/23 1703    Bryson Carbine, MD 06/11/23 1728

## 2023-06-20 ENCOUNTER — Other Ambulatory Visit: Payer: Self-pay

## 2023-06-20 ENCOUNTER — Emergency Department
Admission: EM | Admit: 2023-06-20 | Discharge: 2023-06-20 | Disposition: A | Discharge status: Home / Self Care | Attending: Emergency Medicine | Admitting: Emergency Medicine

## 2023-06-20 ENCOUNTER — Emergency Department

## 2023-06-20 DIAGNOSIS — S299XXA Unspecified injury of thorax, initial encounter: Secondary | ICD-10-CM | POA: Diagnosis present

## 2023-06-20 DIAGNOSIS — R911 Solitary pulmonary nodule: Secondary | ICD-10-CM | POA: Insufficient documentation

## 2023-06-20 DIAGNOSIS — W108XXA Fall (on) (from) other stairs and steps, initial encounter: Secondary | ICD-10-CM | POA: Diagnosis not present

## 2023-06-20 DIAGNOSIS — W19XXXA Unspecified fall, initial encounter: Secondary | ICD-10-CM

## 2023-06-20 DIAGNOSIS — S2241XA Multiple fractures of ribs, right side, initial encounter for closed fracture: Secondary | ICD-10-CM | POA: Insufficient documentation

## 2023-06-20 MED ORDER — LIDOCAINE 5 % EX PTCH: 1.0000 | MEDICATED_PATCH | Freq: Two times a day (BID) | CUTANEOUS | 1 refills | Status: DC

## 2023-06-20 MED ORDER — LIDOCAINE 5 % EX PTCH
1.0000 | MEDICATED_PATCH | CUTANEOUS | Status: DC
  Administered 2023-06-20: 1 via TRANSDERMAL
  Filled 2023-06-20: qty 1

## 2023-06-20 MED ORDER — OXYCODONE HCL 5 MG PO TABS: 5.0000 mg | ORAL_TABLET | Freq: Three times a day (TID) | ORAL | 0 refills | Status: DC | PRN

## 2023-06-20 MED ORDER — OXYCODONE HCL 5 MG PO TABS
5.0000 mg | ORAL_TABLET | Freq: Once | ORAL | Status: AC
  Administered 2023-06-20: 5 mg via ORAL
  Filled 2023-06-20: qty 1

## 2023-06-20 MED ORDER — KETOROLAC TROMETHAMINE 30 MG/ML IJ SOLN
30.0000 mg | Freq: Once | INTRAMUSCULAR | Status: AC
Start: 2023-06-20 — End: 2023-06-20
  Administered 2023-06-20: 30 mg via INTRAMUSCULAR
  Filled 2023-06-20: qty 1

## 2023-06-20 MED ORDER — ACETAMINOPHEN 500 MG PO TABS
1000.0000 mg | ORAL_TABLET | Freq: Once | ORAL | Status: DC
  Filled 2023-06-20: qty 2

## 2023-06-20 NOTE — Discharge Instructions (Addendum)
 Please use lidocaine  patches at your site of pain.  Apply 1 patch at a time, leave on for 12 hours, then remove for 12 hours.  12 hours on, 12 hours off.  Do not apply more than 1 patch at a time.  Oxycodone  3-4 times per day  As we discussed, you can use small doses of Tylenol  up to 2000 mg/day in the setting of your known liver issues.  Incentive spirometer to keep your lungs open to help prevent pneumonia.

## 2023-06-20 NOTE — ED Triage Notes (Signed)
 Pt comes in from home via pov with complaints of right sided rib pain. Pt states last Wednesday he was moving furniture when he lost his balance and hit his side on the threshold of the door. Pt complains of pain 10/10. Pt is alert and oriented x4 with no signs of acute distress at this time.

## 2023-06-20 NOTE — ED Notes (Signed)
 This RN attempted to give tylenol  and Pt refused stating he has Liver Dz and this RN asked if the EDP could Rx Ibuprofen  and Pt stated he has CKD. This RN did remind Pt he didn't need to take tylenol  we were already pain intervening w/ Ketoralac and Lidocaine  patch.

## 2023-06-20 NOTE — ED Provider Notes (Signed)
 [facility] Provider Note    Event Date/Time   First MD Initiated Contact with Patient 06/20/23 [phone]     (approximate)   History   Rib Injury   HPI  Stephen Mayer is a 48 y.o. male who presents to the ED for evaluation of Rib Injury   Review of PCP visit from 4/21.  History of alcohol abuse associated GERD, gastritis, esophageal varices.   Patient presents with right sided rib pain after an injury that occurred Wednesday of last week.  Was moving into a new place, carrying something up steps when he tripped, fell and struck his right sided rib cage on some steps.  No head trauma or syncope.  No emesis, fevers or productive cough.  Reports continued pain   Physical Exam   Triage Vital Signs: ED Triage Vitals [06/20/23 0751]  Encounter Vitals Group     BP 127/79     Systolic BP Percentile      Diastolic BP Percentile      Pulse Rate 73     Resp 18     Temp 97.9 F (36.6 C)     Temp Source Oral     SpO2 100 %     Weight 180 lb (81.6 kg)     Height 5\' 7"  (1.702 m)     Head Circumference      Peak Flow      Pain Score 10     Pain Loc      Pain Education      Exclude from Growth Chart     Most recent vital signs: Vitals:   06/20/23 0751 06/20/23 0809  BP: 127/79   Pulse: 73   Resp: 18   Temp: 97.9 F (36.6 C)   SpO2: 100% 100%    General: Awake, no distress.  CV:  Good peripheral perfusion.  Resp:  Normal effort.  Abd:  No distention.  MSK:  No deformity noted.  Right-sided lateral rib cage tenderness without evidence of open injury.  Benign abdomen but at the costal margin to the RUQ, right flank Neuro:  No focal deficits appreciated. Other:     ED Results / Procedures / Treatments   Labs (all labs ordered are listed, but only abnormal results are displayed) Labs Reviewed - No data to display  EKG   RADIOLOGY Plain films of the right sided ribs and chest interpreted by me with 3 rib fractures without pneumothorax or  focal consolidation. Pulmonary nodules noted  Official radiology report(s): DG Ribs Unilateral W/Chest Right Result Date: 06/20/2023 CLINICAL DATA:  Right-sided rib pain after fall while moving furniture EXAM: RIGHT RIBS AND CHEST - 4 VIEW COMPARISON:  Chest radiograph dated 05/27/2023 FINDINGS: Moderately displaced right lateral fifth and mildly displaced right anterolateral sixth and seventh ribs. Degenerative changes of the bilateral shoulders. There is no evidence of pneumothorax. Minimal lateral pleural thickening underlying the rib fractures. Subtle nodular opacity projects over the lateral left upper lung. Heart size and mediastinal contours are within normal limits. IMPRESSION: 1. Moderately displaced right lateral fifth and mildly displaced right anterolateral sixth and seventh rib fractures. No evidence of pneumothorax. 2. Subtle nodular opacity projects over the lateral left upper lung, which may represent a pulmonary nodule. Recommend further evaluation with nonemergent chest CT. Electronically Signed   By: Limin  Xu M.D.   On: 06/20/2023 08:34    PROCEDURES and INTERVENTIONS:  Procedures  Medications  acetaminophen  (TYLENOL ) tablet 1,000 mg (1,000 mg Oral Patient  Refused/Not Given 06/20/23 0823)  lidocaine  (LIDODERM ) 5 % 1 patch (1 patch Transdermal Patch Applied 06/20/23 0817)  ketorolac  (TORADOL ) 30 MG/ML injection 30 mg (30 mg Intramuscular Given 06/20/23 0817)  oxyCODONE  (Oxy IR/ROXICODONE ) immediate release tablet 5 mg (5 mg Oral Given 06/20/23 0850)     IMPRESSION / MDM / ASSESSMENT AND PLAN / ED COURSE  I reviewed the triage vital signs and the nursing notes.  Differential diagnosis includes, but is not limited to, rib fractures, pneumonia, pneumothorax, solid organ abdominal injury  {Patient presents with symptoms of an acute illness or injury that is potentially life-threatening.  Patient presents with pain after a fall that occurred last week with evidence of multiple rib  fractures suitable for continued outpatient management.  X-rays, as above.  Looks clinically well.  No indications for antibiotics or serum workup.  Provided multimodal analgesia, symptom spirometer and discussed ED return precautions.  Clinical Course as of 06/20/23 0855  Tue Jun 20, 2023  0850 Reassessed and discussed x-ray results, Multimodal pain control, incentive spirometry, expectant management and ED return precautions.  He is Adult nurse. [DS]    Clinical Course User Index [DS] Arline Bennett, MD     FINAL CLINICAL IMPRESSION(S) / ED DIAGNOSES   Final diagnoses:  Closed fracture of multiple ribs of right side, initial encounter  Fall, initial encounter  Lung nodule seen on imaging study     Rx / DC Orders   ED Discharge Orders          Ordered    oxyCODONE  (ROXICODONE ) 5 MG immediate release tablet  Every 8 hours PRN        06/20/23 0841    lidocaine  (LIDODERM ) 5 %  Every 12 hours        06/20/23 0842    AMB  Referral to [facility]        06/20/23 9604             Note:  This document was prepared using Dragon voice recognition software and may include unintentional dictation errors.   Arline Bennett, MD 06/20/23 [phone]

## 2023-06-20 NOTE — ED Notes (Signed)
 Incentive spirometer with teachback given to Pt.

## 2023-06-30 ENCOUNTER — Emergency Department
Admission: EM | Admit: 2023-06-30 | Discharge: 2023-06-30 | Disposition: A | Discharge status: Home / Self Care | Attending: Emergency Medicine | Admitting: Emergency Medicine

## 2023-06-30 ENCOUNTER — Other Ambulatory Visit: Payer: Self-pay

## 2023-06-30 DIAGNOSIS — L02416 Cutaneous abscess of left lower limb: Secondary | ICD-10-CM | POA: Diagnosis present

## 2023-06-30 LAB — CBC WITH DIFFERENTIAL/PLATELET
Abs Immature Granulocytes: 0.01 10*3/uL (ref 0.00–0.07)
Basophils Absolute: 0.1 10*3/uL (ref 0.0–0.1)
Basophils Relative: 2 %
Eosinophils Absolute: 0.1 10*3/uL (ref 0.0–0.5)
Eosinophils Relative: 3 %
HCT: 34.3 % — ABNORMAL LOW (ref 39.0–52.0)
Hemoglobin: 10.5 g/dL — ABNORMAL LOW (ref 13.0–17.0)
Immature Granulocytes: 0 %
Lymphocytes Relative: 27 %
Lymphs Abs: 0.7 10*3/uL (ref 0.7–4.0)
MCH: 25.4 pg — ABNORMAL LOW (ref 26.0–34.0)
MCHC: 30.6 g/dL (ref 30.0–36.0)
MCV: 82.9 fL (ref 80.0–100.0)
Monocytes Absolute: 0.4 10*3/uL (ref 0.1–1.0)
Monocytes Relative: 15 %
Neutro Abs: 1.5 10*3/uL — ABNORMAL LOW (ref 1.7–7.7)
Neutrophils Relative %: 53 %
Platelets: 147 10*3/uL — ABNORMAL LOW (ref 150–400)
RBC: 4.14 MIL/uL — ABNORMAL LOW (ref 4.22–5.81)
RDW: 18.6 % — ABNORMAL HIGH (ref 11.5–15.5)
WBC: 2.8 10*3/uL — ABNORMAL LOW (ref 4.0–10.5)
nRBC: 0 % (ref 0.0–0.2)

## 2023-06-30 LAB — BASIC METABOLIC PANEL WITH GFR
Anion gap: 4 — ABNORMAL LOW (ref 5–15)
BUN: 11 mg/dL (ref 6–20)
CO2: 22 mmol/L (ref 22–32)
Calcium: 8.7 mg/dL — ABNORMAL LOW (ref 8.9–10.3)
Chloride: 115 mmol/L — ABNORMAL HIGH (ref 98–111)
Creatinine, Ser: 0.72 mg/dL (ref 0.61–1.24)
GFR, Estimated: >60 mL/min (ref >60)
Glucose, Bld: 91 mg/dL (ref 70–99)
Potassium: 4.1 mmol/L (ref 3.5–5.1)
Sodium: 141 mmol/L (ref 135–145)

## 2023-06-30 MED ORDER — CEPHALEXIN 500 MG PO CAPS
500.0000 mg | ORAL_CAPSULE | Freq: Three times a day (TID) | ORAL | 0 refills | Status: DC
Start: 2023-06-30 — End: 2023-11-02

## 2023-06-30 MED ORDER — OXYCODONE HCL 5 MG PO TABS
5.0000 mg | ORAL_TABLET | Freq: Once | ORAL | Status: AC
  Administered 2023-06-30: 5 mg via ORAL
  Filled 2023-06-30: qty 1

## 2023-06-30 MED ORDER — OXYCODONE HCL 5 MG PO TABS: 5.0000 mg | ORAL_TABLET | Freq: Four times a day (QID) | ORAL | 0 refills | Status: DC | PRN

## 2023-06-30 MED ORDER — SULFAMETHOXAZOLE-TRIMETHOPRIM 800-160 MG PO TABS: 1.0000 | ORAL_TABLET | Freq: Two times a day (BID) | ORAL | 0 refills | Status: DC

## 2023-06-30 NOTE — ED Provider Notes (Signed)
 [facility] Provider Note    Event Date/Time   First MD Initiated Contact with Patient 06/30/23 0730     (approximate)   History   Abscess   HPI  Stephen Mayer is a 48 y.o. male   presents to the ED with chronic left hip abscess and has been seen for the same in the past several times in this ED.  Patient reports that he had an I&D the last time he was here.  He also has seen his PCP.  He reports that he initially was given doxycycline  which he just now finished.  The Bactrim  DS he did not pick up.  He states that he has kidney and liver issues and should not be taking Tylenol  or ibuprofen .  He denies any fever, chills, nausea or vomiting.  He reports that the area continues to drain from his left hip.  Records indicate that he has an appointment with [facility] orthopedics on 07/05/2023.  Noted on his record from 05/27/2023, ED physician consulted Dr. Annabell Key from [facility] and also Dr. Clyda Dark with [facility] orthopedics who both stated that he needed tertiary level outpatient management for his chronic condition.  Patient has been noncompliant with medication and also in getting an appointment.      Physical Exam   Triage Vital Signs: ED Triage Vitals  Encounter Vitals Group     BP 06/30/23 0622 128/72     Systolic BP Percentile --      Diastolic BP Percentile --      Pulse Rate 06/30/23 0622 (!) 103     Resp 06/30/23 0622 19     Temp 06/30/23 0622 98.3 F (36.8 C)     Temp Source 06/30/23 0622 Oral     SpO2 06/30/23 0622 99 %     Weight 06/30/23 0634 183 lb (83 kg)     Height 06/30/23 0634 5\' 7"  (1.702 m)     Head Circumference --      Peak Flow --      Pain Score 06/30/23 0634 10     Pain Loc --      Pain Education --      Exclude from Growth Chart --     Most recent vital signs: Vitals:   06/30/23 0622  BP: 128/72  Pulse: (!) 103  Resp: 19  Temp: 98.3 F (36.8 C)  SpO2: 99%     General: Awake, no distress.  Alert, cooperative,  patient is ambulatory without any assistance in the hallway. CV:  Good peripheral perfusion.  Resp:  Normal effort.  Abd:  No distention.  Other:  Left hip with noted drainage from site.  No surrounding cellulitis present.   ED Results / Procedures / Treatments   Labs (all labs ordered are listed, but only abnormal results are displayed) Labs Reviewed  CBC WITH DIFFERENTIAL/PLATELET - Abnormal; Notable for the following components:      Result Value   WBC 2.8 (*)    RBC 4.14 (*)    Hemoglobin 10.5 (*)    HCT 34.3 (*)    MCH 25.4 (*)    RDW 18.6 (*)    Platelets 147 (*)    Neutro Abs 1.5 (*)    All other components within normal limits  BASIC METABOLIC PANEL WITH GFR - Abnormal; Notable for the following components:   Chloride 115 (*)    Calcium 8.7 (*)    Anion gap 4 (*)    All other components  within normal limits       PROCEDURES:  Critical Care performed:   Procedures   MEDICATIONS ORDERED IN ED: Medications  oxyCODONE  (Oxy IR/ROXICODONE ) immediate release tablet 5 mg (5 mg Oral Given 06/30/23 0809)     IMPRESSION / MDM / ASSESSMENT AND PLAN / ED COURSE  I reviewed the triage vital signs and the nursing notes.   Differential diagnosis includes, but is not limited to, chronic left hip abscess/infected bursa, left hip pain, patient noncompliant.  48 year old male presents to the ED with complaint of continued left hip pain with drainage from his left hip from an I&D that was done in the emergency department.  Patient currently is not taking any antibiotics and reports that he did not ever pick up the prescription for Bactrim  DS or cephalexin .  I spoke with him with father present that he does have an appointment on 07/05/2023 and that he cannot miss this appointment as he needs care at that facility for his chronic issues.  Also discussed the need for 2 antibiotics which he states now he understands.  A prescription for Bactrim  DS and cephalexin  500 mg was sent to  the pharmacy along with a limited amount of oxycodone  5 mg.  Patient was amatory at the time of discharge without any assistance.  Labs were essentially not changed from his previous lab work with the exception of a improved potassium of 4.1.      Patient's presentation is most consistent with acute illness / injury with system symptoms.  FINAL CLINICAL IMPRESSION(S) / ED DIAGNOSES   Final diagnoses:  Abscess of hip, left     Rx / DC Orders   ED Discharge Orders          Ordered    oxyCODONE  (OXY IR/ROXICODONE ) 5 MG immediate release tablet  Every 6 hours PRN        06/30/23 0921    sulfamethoxazole -trimethoprim  (BACTRIM  DS) 800-160 MG tablet  2 times daily        06/30/23 0921    cephALEXin  (KEFLEX ) 500 MG capsule  3 times daily        06/30/23 1308             Note:  This document was prepared using Dragon voice recognition software and may include unintentional dictation errors.   Stafford Eagles, PA-C 06/30/23 1431    Twilla Galea, MD 06/30/23 1539

## 2023-06-30 NOTE — ED Triage Notes (Addendum)
 Patient C/O left hip abscess that he noticed on Thursday. Patient states that he has been seen for the same in the past, and had to have an I&D. The abscess has obviously returned. Of note, patient states that he should not take Tylenol  or Ibuprofen  because he has kidney and liver issues, though he is not allergic to these medications.

## 2023-06-30 NOTE — ED Notes (Signed)
 See triage notes. Patient c/o an abscess to the left hip that is black and purple and oozing. Patient stated he first noticed it last Thursday.

## 2023-06-30 NOTE — Discharge Instructions (Signed)
 Keep your appointment with the orthopedist at [facility] on May 21.  Do not miss this appointment.  2 antibiotics were sent to the pharmacy to begin taking.  It is important for you to get both antibiotics.  Pain medication was also sent to the pharmacy to take only as directed.  Do not drive or operate machinery while taking this medication as it could cause drowsiness and increase your risk for injury.

## 2023-07-03 ENCOUNTER — Encounter: Admitting: Physician Assistant

## 2023-07-04 ENCOUNTER — Other Ambulatory Visit: Payer: Self-pay

## 2023-07-04 DIAGNOSIS — K219 Gastro-esophageal reflux disease without esophagitis: Secondary | ICD-10-CM

## 2023-07-04 MED ORDER — OMEPRAZOLE 20 MG PO CPDR: DELAYED_RELEASE_CAPSULE | ORAL | 1 refills | Status: DC

## 2023-07-06 ENCOUNTER — Other Ambulatory Visit: Payer: Self-pay

## 2023-07-06 ENCOUNTER — Emergency Department
Admission: EM | Admit: 2023-07-06 | Discharge: 2023-07-06 | Disposition: A | Discharge status: Home / Self Care | Attending: Emergency Medicine | Admitting: Emergency Medicine

## 2023-07-06 DIAGNOSIS — Z96642 Presence of left artificial hip joint: Secondary | ICD-10-CM | POA: Insufficient documentation

## 2023-07-06 DIAGNOSIS — S71002A Unspecified open wound, left hip, initial encounter: Secondary | ICD-10-CM | POA: Insufficient documentation

## 2023-07-06 DIAGNOSIS — S71102A Unspecified open wound, left thigh, initial encounter: Secondary | ICD-10-CM | POA: Insufficient documentation

## 2023-07-06 DIAGNOSIS — S71002D Unspecified open wound, left hip, subsequent encounter: Secondary | ICD-10-CM

## 2023-07-06 LAB — CBC
HCT: 32.1 % — ABNORMAL LOW (ref 39.0–52.0)
Hemoglobin: 10.1 g/dL — ABNORMAL LOW (ref 13.0–17.0)
MCH: 26.1 pg (ref 26.0–34.0)
MCHC: 31.5 g/dL (ref 30.0–36.0)
MCV: 82.9 fL (ref 80.0–100.0)
Platelets: 160 10*3/uL (ref 150–400)
RBC: 3.87 MIL/uL — ABNORMAL LOW (ref 4.22–5.81)
RDW: 18.5 % — ABNORMAL HIGH (ref 11.5–15.5)
WBC: 4.5 10*3/uL (ref 4.0–10.5)
nRBC: 0 % (ref 0.0–0.2)

## 2023-07-06 LAB — BASIC METABOLIC PANEL WITH GFR
Anion gap: 9 (ref 5–15)
BUN: 11 mg/dL (ref 6–20)
CO2: 23 mmol/L (ref 22–32)
Calcium: 9.1 mg/dL (ref 8.9–10.3)
Chloride: 108 mmol/L (ref 98–111)
Creatinine, Ser: 0.78 mg/dL (ref 0.61–1.24)
GFR, Estimated: >60 mL/min (ref >60)
Glucose, Bld: 106 mg/dL — ABNORMAL HIGH (ref 70–99)
Potassium: 4.2 mmol/L (ref 3.5–5.1)
Sodium: 140 mmol/L (ref 135–145)

## 2023-07-06 NOTE — ED Provider Notes (Signed)
 [facility] Provider Note   Event Date/Time   First MD Initiated Contact with Patient 07/06/23 1221     (approximate)  History   Hip Pain  HPI  [name] is a 48 y.o. male with previous left hip arthroplasty, chronic hip effusion   Patient reports that he is now taking doxycycline  and Bactrim .  He is been using antibiotic, and continues to have drainage from the area of the left hip for the last 3 months.  He was to see a specialist at [facility] yesterday but this appointment got canceled and changed to June 2.  He reports that he is here because he is no longer has any prescription pain medication.  He talked to his primary care, and he reports primary care said he needed to go to the ER for further  He relates a chronic and ongoing pain discomfort with walking movement it is located around the left buttock left hip area.  He reports its actually looking better there is less redness there is no fever he is seeing a little bit of drainage but persistence of pain.    Physical Exam   Triage Vital Signs: ED Triage Vitals [07/06/23 1107]  Encounter Vitals Group     BP (!) 130/114     Systolic BP Percentile      Diastolic BP Percentile      Pulse Rate 93     Resp 20     Temp 98.1 F (36.7 C)     Temp Source Oral     SpO2 98 %     Weight 182 lb 15.7 oz (83 kg)     Height 5\' 7"  (1.702 m)     Head Circumference      Peak Flow      Pain Score      Pain Loc      Pain Education      Exclude from Growth Chart     Most recent vital signs: Vitals:   07/06/23 1107  BP: (!) 130/114  Pulse: 93  Resp: 20  Temp: 98.1 F (36.7 C)  SpO2: 98%     General: Awake, no distress.  Ambulatory with slightly antalgic gait CV:  Good peripheral perfusion.  Warm well-perfused left foot with palpable dorsalis pedis and posterior tibial pulses Resp:  Normal effort.  Abd:  No distention.  Other:  Over the left lateral hip/trochanteric region he has a very small  punctate lesion that appears to have serosanguineous drainage from it.  It is very minor.  He has a dry dressing placed over it.  There is no obvious frank purulence or pus emerging.  There is no induration there is no obvious wound or erythema tracking down over the remainder of the thigh and her leg etc.  He demonstrates 5 out of 5 strength in hip flexion extension.   ED Results / Procedures / Treatments   Labs (all labs ordered are listed, but only abnormal results are displayed) Labs Reviewed  CBC - Abnormal; Notable for the following components:      Result Value   RBC 3.87 (*)    Hemoglobin 10.1 (*)    HCT 32.1 (*)    RDW 18.5 (*)    All other components within normal limits  BASIC METABOLIC PANEL WITH GFR - Abnormal; Notable for the following components:   Glucose, Bld 106 (*)    All other components within normal limits     EKG  RADIOLOGY     PROCEDURES:  Critical Care performed: No  Procedures   MEDICATIONS ORDERED IN ED: Medications - No data to display   IMPRESSION / MDM / ASSESSMENT AND PLAN / ED COURSE  I reviewed the triage vital signs and the nursing notes.                              Differential diagnosis includes, but is not limited to, chronic hip joint infection or bursitis.  Patient currently on antibiotics afebrile reports improvement in wound.  Based on reading previous notes from the ER, orthopedics, and speaking with his primary care physician on the phone (L McDonough) today this seems to be very chronic in nature and he is awaiting specialist follow-up at [facility].  He has no evidence to support all acute worsening or development of septicemia etc.  The patient reports that he is run out of oxycodone  and needs additional prescription.  I reviewed his prescriber database his score is well over 200, and he has had multiple prescriptions from various physicians.  Discussed with the patient, and I am not comfortable prescribing further oxycodone .  I  did refer him back to see consult from his primary care who I also spoke to on the phone she reports that he is attending narrative of some missed appointments, etc   Patient's presentation is most consistent with acute, uncomplicated illness.   Stable perhaps improving based on previous history and history provided by patient.  Patient able to ambulate, does not appear to be in severe or excruciating pain at this time.  Patient understanding to follow-up with his primary care and will be following with [facility] orthopedics as scheduled.  Discussed return precautions.  Patient relates that he does not wish to stay to receive his discharge paperwork, but I did review plan of care and the plan to follow-up with [facility] and his primary with him prior to his departure         FINAL CLINICAL IMPRESSION(S) / ED DIAGNOSES   Final diagnoses:  Open wound of left hip and thigh with complication, subsequent encounter     Rx / DC Orders   ED Discharge Orders     None        Note:  This document was prepared using Dragon voice recognition software and may include unintentional dictation errors.   Iver Marker, MD 07/06/23 1256
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
## 2023-08-07 ENCOUNTER — Encounter: Payer: Self-pay | Admitting: Physician Assistant

## 2023-08-07 ENCOUNTER — Ambulatory Visit (INDEPENDENT_AMBULATORY_CARE_PROVIDER_SITE_OTHER): Admitting: Physician Assistant

## 2023-08-07 VITALS — BP 133/78 | HR 99 | Temp 98.1°F | Resp 16 | Ht 67.0 in | Wt 183.0 lb

## 2023-08-07 DIAGNOSIS — K861 Other chronic pancreatitis: Secondary | ICD-10-CM

## 2023-08-07 DIAGNOSIS — K7030 Alcoholic cirrhosis of liver without ascites: Secondary | ICD-10-CM | POA: Diagnosis not present

## 2023-08-07 DIAGNOSIS — Z0001 Encounter for general adult medical examination with abnormal findings: Secondary | ICD-10-CM | POA: Diagnosis not present

## 2023-08-07 DIAGNOSIS — K219 Gastro-esophageal reflux disease without esophagitis: Secondary | ICD-10-CM | POA: Diagnosis not present

## 2023-08-07 DIAGNOSIS — M25552 Pain in left hip: Secondary | ICD-10-CM

## 2023-08-07 MED ORDER — OXYCODONE HCL 5 MG PO TABS: 5.0000 mg | ORAL_TABLET | Freq: Every day | ORAL | 0 refills | Status: DC | PRN

## 2023-08-07 NOTE — Progress Notes (Signed)
 [facility] [address] [place], [place] [address]  Internal MEDICINE  Office Visit Note  Patient Name: Stephen Mayer  [id number]  [id number]  Date of Service: 08/07/2023  Chief Complaint  Patient presents with   Annual Exam   Quality Metric Gaps    Colonoscopy   Medication Refill    ABX for hip     HPI Pt is here for routine health maintenance examination -saw ortho a few weeks ago, went to [facility]. States they were going to consult with surgeons and would hear in a week, but hasn't heard anything -states abscess had gotten better with keflex  and bactrim  given 5/16, but seems to be worsening again with bursitis -again only short supply pain meds given. Pt needs to establish with pain management if ortho not moving forward with surgery. Cannot continue to seek pain meds from our office and ED -pt also needs to get back in with GI for cirrhosis and chronic pancreatitis -discussed need to address health maintenance, but pt reports he can only focus on hip right now  Current Medication: Outpatient Encounter Medications as of 08/07/2023  Medication Sig   cephALEXin  (KEFLEX ) 500 MG capsule Take 1 capsule (500 mg total) by mouth 3 (three) times daily.   famotidine  (PEPCID ) 20 MG tablet Take 1 tablet (20 mg total) by mouth 2 (two) times daily for 14 days.   hydrochlorothiazide  (HYDRODIURIL ) 12.5 MG tablet Take 1 tablet (12.5 mg total) by mouth daily.   lidocaine  (LIDODERM ) 5 % Place 1 patch onto the skin every 12 (twelve) hours. Remove & Discard patch within 12 hours or as directed by MD   meloxicam  (MOBIC ) 15 MG tablet Take 15 mg by mouth daily.   Multiple Vitamin (MULTIVITAMIN WITH MINERALS) TABS tablet Take 1 tablet by mouth daily.   omeprazole  (PRILOSEC) 20 MG capsule Take 1 capsule by mouth daily   sulfamethoxazole -trimethoprim  (BACTRIM  DS) 800-160 MG tablet Take 1 tablet by mouth 2 (two) times daily.   tamsulosin (FLOMAX) 0.4 MG CAPS capsule Take 0.4 mg by mouth  daily.   [DISCONTINUED] oxyCODONE  (OXY IR/ROXICODONE ) 5 MG immediate release tablet Take 1 tablet (5 mg total) by mouth every 6 (six) hours as needed.   oxyCODONE  (OXY IR/ROXICODONE ) 5 MG immediate release tablet Take 1 tablet (5 mg total) by mouth daily as needed.   No facility-administered encounter medications on file as of 08/07/2023.    Surgical History: Past Surgical History:  Procedure Laterality Date   ESOPHAGOGASTRODUODENOSCOPY (EGD) WITH PROPOFOL  N/A 08/27/2022   Procedure: ESOPHAGOGASTRODUODENOSCOPY (EGD) WITH PROPOFOL ;  Surgeon: Maryruth Ole ONEIDA, MD;  Location: [facility] ENDOSCOPY;  Service: Endoscopy;  Laterality: N/A;   REPLACEMENT TOTAL KNEE Bilateral 2021   TOTAL HIP ARTHROPLASTY Bilateral 2021    Medical History: Past Medical History:  Diagnosis Date   Arthritis    Bursitis    L hip   CKD (chronic kidney disease)    Hematemesis with nausea 08/25/2022   Liver failure (HCC)    Osteonecrosis (HCC)    all my joints    Family History: Family History  Problem Relation Age of Onset   Kidney disease Mother    Diabetes Mother    Diabetes Brother       Review of Systems  Constitutional:  Negative for chills, fatigue and unexpected weight change.  HENT:  Negative for congestion, postnasal drip, rhinorrhea, sneezing and sore throat.   Eyes:  Negative for redness.  Respiratory:  Negative for cough, chest tightness and shortness of breath.  Cardiovascular:  Negative for chest pain and palpitations.  Gastrointestinal:  Negative for constipation, diarrhea, nausea and vomiting.  Genitourinary:  Negative for frequency.  Musculoskeletal:  Positive for arthralgias.  Skin:  Negative for rash.  Neurological:  Positive for numbness. Negative for tremors.  Hematological:  Negative for adenopathy. Does not bruise/bleed easily.  Psychiatric/Behavioral:  Negative for behavioral problems (Depression), sleep disturbance and suicidal ideas.      Vital Signs: BP 133/78    Pulse 99   Temp 98.1 F (36.7 C)   Resp 16   Ht 5' 7 (1.702 m)   Wt 183 lb (83 kg)   SpO2 99%   BMI 28.66 kg/m    Physical Exam Vitals and nursing note reviewed.  Constitutional:      General: He is not in acute distress.    Appearance: Normal appearance. He is well-developed. He is not diaphoretic.  HENT:     Head: Normocephalic and atraumatic.  Neck:     Thyroid: No thyromegaly.     Vascular: No JVD.     Trachea: No tracheal deviation.  Cardiovascular:     Rate and Rhythm: Normal rate and regular rhythm.     Heart sounds: Normal heart sounds. No murmur heard.    No friction rub. No gallop.  Pulmonary:     Effort: Pulmonary effort is normal.  Skin:    General: Skin is warm and dry.     Findings: No erythema.  Neurological:     Mental Status: He is alert and oriented to person, place, and time.  Psychiatric:        Behavior: Behavior normal.        Thought Content: Thought content normal.        Judgment: Judgment normal.      LABS: Recent Results (from the past 2160 hours)  Aerobic/Anaerobic Culture w Gram Stain (surgical/deep wound)     Status: None   Collection Time: 06/06/23  8:27 AM   Specimen: Wound  Result Value Ref Range   Specimen Description      WOUND Performed at [facility], [address], [place], [place] [address]    Special Requests      NONE Performed at [facility], [address] [place], [place] [address]    Gram Stain      MODERATE WBC PRESENT, PREDOMINANTLY PMN NO ORGANISMS SEEN    Culture      No growth aerobically or anaerobically. Performed at [facility], [address] [place], [place] [address]    Report Status 06/11/2023 FINAL   CBC with Differential     Status: Abnormal   Collection Time: 06/30/23  8:12 AM  Result Value Ref Range   WBC 2.8 (L) 4.0 - 10.5 K/uL   RBC 4.14 (L) 4.22 - 5.81 MIL/uL   Hemoglobin 10.5 (L) 13.0 - 17.0 g/dL   HCT 65.6 (L) 60.9 - 47.9 %   MCV 82.9 80.0 - 100.0  fL   MCH 25.4 (L) 26.0 - 34.0 pg   MCHC 30.6 30.0 - 36.0 g/dL   RDW 81.3 (H) 88.4 - 84.4 %   Platelets 147 (L) 150 - 400 K/uL   nRBC 0.0 0.0 - 0.2 %   Neutrophils Relative % 53 %   Neutro Abs 1.5 (L) 1.7 - 7.7 K/uL   Lymphocytes Relative 27 %   Lymphs Abs 0.7 0.7 - 4.0 K/uL   Monocytes Relative 15 %   Monocytes Absolute 0.4 0.1 -  1.0 K/uL   Eosinophils Relative 3 %   Eosinophils Absolute 0.1 0.0 - 0.5 K/uL   Basophils Relative 2 %   Basophils Absolute 0.1 0.0 - 0.1 K/uL   Immature Granulocytes 0 %   Abs Immature Granulocytes 0.01 0.00 - 0.07 K/uL    Comment: Performed at [facility], [address], [place], [place] [address]  Basic metabolic panel     Status: Abnormal   Collection Time: 06/30/23  8:12 AM  Result Value Ref Range   Sodium 141 135 - 145 mmol/L   Potassium 4.1 3.5 - 5.1 mmol/L   Chloride 115 (H) 98 - 111 mmol/L   CO2 22 22 - 32 mmol/L   Glucose, Bld 91 70 - 99 mg/dL    Comment: Glucose reference range applies only to samples taken after fasting for at least 8 hours.   BUN 11 6 - 20 mg/dL   Creatinine, Ser 9.27 0.61 - 1.24 mg/dL   Calcium 8.7 (L) 8.9 - 10.3 mg/dL   GFR, Estimated >39 >39 mL/min    Comment: (NOTE) Calculated using the CKD-EPI Creatinine Equation (2021)    Anion gap 4 (L) 5 - 15    Comment: Performed at [facility], [address] [place], [place] [address]  CBC     Status: Abnormal   Collection Time: 07/06/23 11:28 AM  Result Value Ref Range   WBC 4.5 4.0 - 10.5 K/uL   RBC 3.87 (L) 4.22 - 5.81 MIL/uL   Hemoglobin 10.1 (L) 13.0 - 17.0 g/dL   HCT 67.8 (L) 60.9 - 47.9 %   MCV 82.9 80.0 - 100.0 fL   MCH 26.1 26.0 - 34.0 pg   MCHC 31.5 30.0 - 36.0 g/dL   RDW 81.4 (H) 88.4 - 84.4 %   Platelets 160 150 - 400 K/uL   nRBC 0.0 0.0 - 0.2 %    Comment: Performed at [facility], [address] [place], [place] [address]  Basic metabolic panel     Status: Abnormal   Collection Time: 07/06/23 11:28 AM  Result Value  Ref Range   Sodium 140 135 - 145 mmol/L   Potassium 4.2 3.5 - 5.1 mmol/L   Chloride 108 98 - 111 mmol/L   CO2 23 22 - 32 mmol/L   Glucose, Bld 106 (H) 70 - 99 mg/dL    Comment: Glucose reference range applies only to samples taken after fasting for at least 8 hours.   BUN 11 6 - 20 mg/dL   Creatinine, Ser 9.21 0.61 - 1.24 mg/dL   Calcium 9.1 8.9 - 89.6 mg/dL   GFR, Estimated >39 >39 mL/min    Comment: (NOTE) Calculated using the CKD-EPI Creatinine Equation (2021)    Anion gap 9 5 - 15    Comment: Performed at [facility], [address], [place], [place] [address]        Assessment/Plan: 1. Encounter for general adult medical examination with abnormal findings (Primary) CPE performed, due for colonoscopy  2. Alcoholic cirrhosis of liver without ascites (HCC) Needs to get back in with GI - Ambulatory referral to Gastroenterology  3. Hyperbilirubinemia - Ambulatory referral to Gastroenterology  4. Chronic pancreatitis, unspecified pancreatitis type [facility]) - Ambulatory referral to Gastroenterology  5. Gastroesophageal reflux disease, unspecified whether esophagitis present - Ambulatory referral to Gastroenterology  6. Left hip pain Pt needs to follow up with ortho and have them treat pain accordingly or establish with pain management   General Counseling: Prentice  verbalizes understanding of the findings of todays visit and agrees with plan of treatment. I have discussed any further diagnostic evaluation that may be needed or ordered today. We also reviewed his medications today. he has been encouraged to call the office with any questions or concerns that should arise related to todays visit.    Counseling:    Orders Placed This Encounter  Procedures   Ambulatory referral to Gastroenterology    Meds ordered this encounter  Medications   oxyCODONE  (OXY IR/ROXICODONE ) 5 MG immediate release tablet    Sig: Take 1 tablet (5 mg total) by mouth daily as  needed.    Dispense:  12 tablet    Refill:  0    This patient was seen by Tinnie Pro, PA-C in collaboration with Dr. Sigrid Bathe as a part of collaborative care agreement.  Total time spent:35 Minutes  Time spent includes review of chart, medications, test results, and follow up plan with the patient.     Sigrid CHRISTELLA Bathe, MD  Internal Medicine

## 2023-08-08 ENCOUNTER — Telehealth: Payer: Self-pay | Admitting: Physician Assistant

## 2023-08-08 NOTE — Telephone Encounter (Signed)
 Awaiting 08/07/23 office notes for GI referral-Toni
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
## 2023-08-29 ENCOUNTER — Telehealth: Payer: Self-pay | Admitting: Physician Assistant

## 2023-08-29 NOTE — Telephone Encounter (Signed)
 GI referral sent via [facility] to [facility].  Notified patient. Gave telephone # [phone]

## 2023-08-30 ENCOUNTER — Telehealth: Payer: Self-pay | Admitting: Orthopedic Surgery

## 2023-08-30 NOTE — Telephone Encounter (Signed)
 DR. ONESIMO   Patient called stating he has a referral to come back and see Dr. ONESIMO ( we do not have the referral in the system yet he just seen the doctor yesterday he said) the [facility] note is in his chart. Patient states he has an infection in his hip and it is aspirating.  He said they are wanting him to see Dr. ONESIMO in [place].   I advised the patient we do not have the referral yet and I will send a message for Dr. ONESIMO to review.  He said oh so we can just send him somewhere else, that is all the doctors are doing and no one wants to help him.   Dr. ONESIMO please review his chart and advise what to do.    Leta please call him back at [phone] and advise him what to do.

## 2023-09-08 ENCOUNTER — Telehealth: Payer: Self-pay

## 2023-09-08 NOTE — Telephone Encounter (Signed)
 Patient called back concerning an appointment.  Stated that a referral was sent.

## 2023-09-12 ENCOUNTER — Telehealth: Payer: Self-pay | Admitting: Physician Assistant

## 2023-09-12 NOTE — Telephone Encounter (Signed)
 Gastroenterology appointment 11/16/2023 @ [facility]

## 2023-09-13 ENCOUNTER — Ambulatory Visit: Admitting: Orthopedic Surgery

## 2023-09-20 ENCOUNTER — Ambulatory Visit (INDEPENDENT_AMBULATORY_CARE_PROVIDER_SITE_OTHER): Admitting: Orthopedic Surgery

## 2023-09-20 DIAGNOSIS — M25452 Effusion, left hip: Secondary | ICD-10-CM

## 2023-09-20 MED ORDER — OXYCODONE HCL 5 MG PO TABS: 5.0000 mg | ORAL_TABLET | ORAL | 0 refills | Status: AC | PRN

## 2023-09-20 NOTE — Patient Instructions (Addendum)
 Will place order to get an aspiration of the left hip  Continue with dressing changes  Communicate through MyChart  We will schedule a follow up once the aspiration results are available.

## 2023-09-21 ENCOUNTER — Encounter: Payer: Self-pay | Admitting: Orthopedic Surgery
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
## 2023-09-26 ENCOUNTER — Telehealth: Payer: Self-pay

## 2023-09-26 NOTE — Telephone Encounter (Signed)
 Received call from [facility] stating that they need an order changed for this patient. The order that needs to be changed is from 09/20/23. They need someone to E-sign it since Dr. Onesimo is out of office. Below is what they need the new order to be for and the call back number.  US  guided aspiration  Clarita Ricker - [phone]

## 2023-09-26 NOTE — Progress Notes (Signed)
 Karalee Wilkie POUR, MD sent to [facility] Clarita RAMAN Very helpful, well done.  Approved for US  guided aspiration of left trochanteric bursa/fluid.  HKM

## 2023-09-26 NOTE — Telephone Encounter (Signed)
 I called and spoke with Clarita and go the order changed.

## 2023-09-28 NOTE — Progress Notes (Signed)
 Patient for US  guided aspiration of left trochanteric bursa/fluid on Friday 09/29/23. I called and spoke with the patient on the phone and gave pre-procedure instructions. Pt was made aware to be here at 2p and check in at the [facility] registration desk. Pt stated understanding. Called 09/28/23

## 2023-09-29 ENCOUNTER — Ambulatory Visit
Admission: RE | Admit: 2023-09-29 | Discharge: 2023-09-29 | Disposition: A | Discharge status: Home / Self Care | Source: Ambulatory Visit | Attending: Orthopedic Surgery | Admitting: Orthopedic Surgery

## 2023-09-29 DIAGNOSIS — M7062 Trochanteric bursitis, left hip: Secondary | ICD-10-CM | POA: Insufficient documentation

## 2023-09-29 DIAGNOSIS — M25452 Effusion, left hip: Secondary | ICD-10-CM | POA: Diagnosis present

## 2023-09-29 MED ORDER — LIDOCAINE HCL 1 % IJ SOLN
1.0000 mL | Freq: Once | INTRAMUSCULAR | Status: AC
  Administered 2023-09-29: 1 mL
  Filled 2023-09-29: qty 1

## 2023-10-02 ENCOUNTER — Encounter: Payer: Self-pay | Admitting: Physician Assistant

## 2023-10-02 ENCOUNTER — Ambulatory Visit: Admitting: Physician Assistant

## 2023-10-02 VITALS — BP 133/70 | HR 103 | Temp 98.2°F | Resp 16 | Ht 67.0 in | Wt 186.0 lb

## 2023-10-02 DIAGNOSIS — M25552 Pain in left hip: Secondary | ICD-10-CM

## 2023-10-02 DIAGNOSIS — Z96643 Presence of artificial hip joint, bilateral: Secondary | ICD-10-CM | POA: Diagnosis not present

## 2023-10-02 DIAGNOSIS — G8929 Other chronic pain: Secondary | ICD-10-CM | POA: Diagnosis not present

## 2023-10-02 DIAGNOSIS — K219 Gastro-esophageal reflux disease without esophagitis: Secondary | ICD-10-CM

## 2023-10-02 MED ORDER — OMEPRAZOLE 20 MG PO CPDR: DELAYED_RELEASE_CAPSULE | ORAL | 1 refills | Status: AC

## 2023-10-02 MED ORDER — OXYCODONE HCL 5 MG PO CAPS: 5.0000 mg | ORAL_CAPSULE | Freq: Every day | ORAL | 0 refills | Status: DC | PRN
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
## 2023-10-05 LAB — AEROBIC/ANAEROBIC CULTURE W GRAM STAIN (SURGICAL/DEEP WOUND): Gram Stain: NONE SEEN

## 2023-10-16 ENCOUNTER — Emergency Department
Admission: EM | Admit: 2023-10-16 | Discharge: 2023-10-16 | Disposition: A | Discharge status: Home / Self Care | Attending: Emergency Medicine | Admitting: Emergency Medicine

## 2023-10-16 ENCOUNTER — Emergency Department

## 2023-10-16 ENCOUNTER — Other Ambulatory Visit: Payer: Self-pay

## 2023-10-16 DIAGNOSIS — M25511 Pain in right shoulder: Secondary | ICD-10-CM | POA: Diagnosis present

## 2023-10-16 DIAGNOSIS — M150 Primary generalized (osteo)arthritis: Secondary | ICD-10-CM | POA: Diagnosis not present

## 2023-10-16 MED ORDER — LIDOCAINE 5 % EX PTCH
1.0000 | MEDICATED_PATCH | CUTANEOUS | Status: DC
  Administered 2023-10-16: 1 via TRANSDERMAL
  Filled 2023-10-16: qty 1

## 2023-10-16 MED ORDER — LIDOCAINE 5 % EX PTCH: 1.0000 | MEDICATED_PATCH | Freq: Two times a day (BID) | CUTANEOUS | 0 refills | Status: AC

## 2023-10-16 MED ORDER — OXYCODONE HCL 5 MG PO TABS
10.0000 mg | ORAL_TABLET | Freq: Once | ORAL | Status: AC
  Administered 2023-10-16: 10 mg via ORAL
  Filled 2023-10-16: qty 2

## 2023-10-16 MED ORDER — OXYCODONE HCL 5 MG PO TABS: 5.0000 mg | ORAL_TABLET | Freq: Four times a day (QID) | ORAL | 0 refills | Status: DC | PRN

## 2023-10-16 NOTE — ED Provider Notes (Signed)
 [facility] Provider Note    Event Date/Time   First MD Initiated Contact with Patient 10/16/23 1519     (approximate)   History   Shoulder Pain    HPI  Stephen Mayer is a 48 y.o. male    with a past medical history of effusion of the left hip, open wounds on left hip, abscess of left hip, greater trochanteric bursitis, GAD, who presents to the ED complaining of right shoulder pain. According to the patient, he has osteonecrosis, symptoms started on Friday, 4 days ago, with worsening although he has right shoulder pain.  Patient endorses he is unable to extend the right shoulder, unable to rotate, elevate due to pain.  Because of his diagnosis he already had bilateral knee replacement, bilateral hip replacement.  Patient denies numbness or tingling on his arm or hand.  Patient denies trauma. Patient cannot take acetaminophen  due to his cirrhosis.  Patient is here by himself.  Patient is here looking for pain management and referral to orthopedics.     Patient Active Problem List   Diagnosis Date Noted   Acute on chronic pancreatitis (HCC) 01/11/2023   Anemia 01/11/2023   Alcoholic hepatitis without ascites 09/08/2022   Acute alcoholic hepatitis 09/07/2022   ETOH abuse 09/04/2022   Hyperbilirubinemia 09/04/2022   Chronic pain 08/25/2022   Elevated LFTs 08/25/2022   Alcoholic cirrhosis of liver without ascites (HCC) 08/25/2022   Pancytopenia (HCC) 08/25/2022     ROS: Patient currently denies any vision changes, tinnitus, difficulty speaking, facial droop, sore throat, chest pain, shortness of breath, abdominal pain, nausea/vomiting/diarrhea, dysuria, or weakness/numbness/paresthesias in any extremity   Physical Exam   Triage Vital Signs: ED Triage Vitals  Encounter Vitals Group     BP 10/16/23 1358 122/79     Girls Systolic BP Percentile --      Girls Diastolic BP Percentile --      Boys Systolic BP Percentile --      Boys Diastolic BP  Percentile --      Pulse Rate 10/16/23 1356 99     Resp 10/16/23 1356 18     Temp 10/16/23 1356 98.3 F (36.8 C)     Temp Source 10/16/23 1356 Oral     SpO2 10/16/23 1356 98 %     Weight 10/16/23 1429 187 lb 6.3 oz (85 kg)     Height 10/16/23 1429 5' 7 (1.702 m)     Head Circumference --      Peak Flow --      Pain Score 10/16/23 1357 10     Pain Loc --      Pain Education --      Exclude from Growth Chart --     Most recent vital signs: Vitals:   10/16/23 1356 10/16/23 1358  BP:  122/79  Pulse: 99   Resp: 18   Temp: 98.3 F (36.8 C)   SpO2: 98%      Physical Exam Vitals and nursing note reviewed.  Vital signs were normal during triage  Constitutional:      General: Awake and alert. No acute distress.    Appearance: Normal appearance. The patient is normal weight.      Able to speak in complete sentences without cough or dyspnea  HENT:     Head: Normocephalic and atraumatic.     Mouth: Mucous membranes are moist.  Eyes:     General: PERRL. Normal EOMs  Conjunctiva/sclera: Conjunctivae normal.  Nose No congestion/rhinorrhea  CV:                  Good peripheral perfusion.  Regular rate and rhythm  Resp:               Normal effort.  Equal breath sounds bilaterally.  Abd:                 No distention.  Soft, nontender.  No rebound or guarding.  Musculoskeletal:        General: No swelling. Normal range of motion.  Right shoulder: Skin is intact, no ecchymosis no hematomas no deformities.  No tenderness to palpation.  Full ROM is limited by pain.  Extension is 30 degrees.  Hand : fingers full ROM, sensation intact, pulses positive, strength 5/5 Skin:    General: Skin is warm and dry.     Capillary Refill: Capillary refill takes less than 2 seconds.     Findings: No rash.  Neurological:     Mental Status: The patient is awake and alert. MAE spontaneously. No gross focal neurologic deficits are appreciated.  Psychiatric Mood and affect are normal. Speech  and behavior are normal.  ED Results / Procedures / Treatments   Labs (all labs ordered are listed, but only abnormal results are displayed) Labs Reviewed - No data to display   EKG     RADIOLOGY I independently reviewed and interpreted imaging and agree with radiologists findings.      PROCEDURES:  Critical Care performed:   Procedures   MEDICATIONS ORDERED IN ED: Medications  lidocaine  (LIDODERM ) 5 % 1 patch (1 patch Transdermal Patch Applied 10/16/23 1556)  oxyCODONE  (Oxy IR/ROXICODONE ) immediate release tablet 10 mg (10 mg Oral Given 10/16/23 1554)   Clinical Course as of 10/16/23 1557  Mon Oct 16, 2023  1521 DG Shoulder Right  Severe glenohumeral joint osteoarthritis. 2. Mild degenerative changes of the acromioclavicular joint.   [AE]    Clinical Course User Index [AE] Janit Kast, PA-C    IMPRESSION / MDM / ASSESSMENT AND PLAN / ED COURSE  I reviewed the triage vital signs and the nursing notes.  Differential diagnosis includes, but is not limited to, osteoarthritis, osteonecrosis, fracture, dislocation, unlikely cervical radiculopathy  Patient's presentation is most consistent with acute complicated illness / injury requiring diagnostic workup.    Stephen Mayer is a 68 y.o., male who presents today with history of 4 days of worsening right shoulder pain, full ROM limited.  Patient with history of osteonecrosis, cirrhosis.  On physical exam the skin is intact there is no evidence of trauma, no deformities.  Full ROM is limited by pain.  Fingers full ROM, pulses positive, sensation intact. Plan Oxycodone  Sling Referral to orthopedics Patient's diagnosis is consistent with glenohumeral joint osteoarthrosis. . I independently reviewed and interpreted imaging and agree with radiologists findings ruling out fracture or dislocation.  Did not order any labs, physical exam is reassuring.  I did review the patient's allergies and medications.The patient is in  stable and satisfactory condition for discharge home  Patient will be discharged home with prescriptions for oxycodone .  I did advise the patient not to drive while taking oxycodone . Patient is to follow up with orthopedics as needed or otherwise directed. Patient is given ED precautions to return to the ED for any worsening or new symptoms. Discussed plan of care with patient, answered all of patient's questions, Patient agreeable to plan of care. Advised patient to  take medications according to the instructions on the label. Discussed possible side effects of new medications. Patient verbalized understanding.   FINAL CLINICAL IMPRESSION(S) / ED DIAGNOSES   Final diagnoses:  Acute pain of right shoulder  Primary osteoarthritis involving multiple joints     Rx / DC Orders   ED Discharge Orders          Ordered    lidocaine  (LIDODERM ) 5 %  Every 12 hours        10/16/23 1552    oxyCODONE  (ROXICODONE ) 5 MG immediate release tablet  Every 6 hours PRN        10/16/23 1552             Note:  This document was prepared using Dragon voice recognition software and may include unintentional dictation errors.   Janit Kast, PA-C 10/16/23 1557    Willo Dunnings, MD 10/16/23 2258

## 2023-10-16 NOTE — ED Notes (Addendum)
 See triage note  Presents with pain to right shoulder  Hx of osteonecrosis   Denies any injury   But states he has limited movement to both arms

## 2023-10-16 NOTE — Discharge Instructions (Addendum)
 You have been diagnosed with osteoarthrosis of your right shoulder.  Please take oxycodone  1 tablet by mouth every 6 hours as needed for pain.  Please call Dr. Hooten and make an appointment for further studies and treatment.  Please drink plenty of fluids.  Please come back to ED or go to your PCP if you have new symptoms or symptoms worsen.  It was a pleasure to help you today

## 2023-10-16 NOTE — ED Triage Notes (Signed)
 Pt to ED via POV from home. Pt reports has end stage osteonecrosis and reports worsening right shoulder pain and can no longer lift it up.

## 2023-10-19 ENCOUNTER — Telehealth: Payer: Self-pay | Admitting: Orthopedic Surgery

## 2023-10-19 ENCOUNTER — Telehealth: Payer: Self-pay | Admitting: Physician Assistant

## 2023-10-19 NOTE — Telephone Encounter (Signed)
 Patient called regarding pain mgmt referral. I called [facility] pain management. Was told they will pull patient's information and give to provider to review. I explained to her this was sent as an urgent referral 10/02/23 and need this done as soon as possible. They will get to provider today, then call patient. I lvm notifying patient-Stephen Mayer

## 2023-10-19 NOTE — Telephone Encounter (Signed)
 Dr. Onesimo pt  - pt lvm stating he had labs/cultures done over three weeks ago and no one has called him with the results.  He would like a call back.  [phone]

## 2023-10-20 ENCOUNTER — Other Ambulatory Visit: Payer: Self-pay | Admitting: Orthopedic Surgery

## 2023-10-20 NOTE — Telephone Encounter (Signed)
 Called and LVM letting pt know Dr. Onesimo has been out of the office, but if there were urgent labs they would've been routed to another provider. Callback information also given in case of more questions or concerns.

## 2023-10-25 ENCOUNTER — Telehealth: Payer: Self-pay | Admitting: Orthopedic Surgery

## 2023-10-25 NOTE — Telephone Encounter (Signed)
 Dr. Onesimo pt - pt left a long message about updating his pharmacy to [facility] in [place] and would like his meds sent there [phone]

## 2023-10-26 MED ORDER — OXYCODONE HCL 5 MG PO TABS: 5.0000 mg | ORAL_TABLET | Freq: Four times a day (QID) | ORAL | 0 refills | Status: DC | PRN

## 2023-10-30 ENCOUNTER — Other Ambulatory Visit: Payer: Self-pay | Admitting: Orthopedic Surgery

## 2023-10-30 DIAGNOSIS — M19011 Primary osteoarthritis, right shoulder: Secondary | ICD-10-CM

## 2023-10-31 ENCOUNTER — Telehealth: Payer: Self-pay | Admitting: Physician Assistant

## 2023-10-31 ENCOUNTER — Other Ambulatory Visit

## 2023-10-31 NOTE — Telephone Encounter (Signed)
 Urgent Pain Management referral sent via [facility] to [facility] per patient request. Gave telephone # [phone]. Made patient aware this is 2nd time referral has been sent out for PM & will not be sent out again-Toni

## 2023-11-02 ENCOUNTER — Emergency Department
Admission: EM | Admit: 2023-11-02 | Discharge: 2023-11-02 | Disposition: A | Discharge status: Home / Self Care | Attending: Emergency Medicine | Admitting: Emergency Medicine

## 2023-11-02 ENCOUNTER — Emergency Department

## 2023-11-02 ENCOUNTER — Encounter: Payer: Self-pay | Admitting: Emergency Medicine

## 2023-11-02 ENCOUNTER — Other Ambulatory Visit: Payer: Self-pay

## 2023-11-02 DIAGNOSIS — Y92019 Unspecified place in single-family (private) house as the place of occurrence of the external cause: Secondary | ICD-10-CM | POA: Insufficient documentation

## 2023-11-02 DIAGNOSIS — M79601 Pain in right arm: Secondary | ICD-10-CM | POA: Diagnosis present

## 2023-11-02 DIAGNOSIS — W06XXXA Fall from bed, initial encounter: Secondary | ICD-10-CM | POA: Insufficient documentation

## 2023-11-02 DIAGNOSIS — N189 Chronic kidney disease, unspecified: Secondary | ICD-10-CM | POA: Diagnosis not present

## 2023-11-02 MED ORDER — OXYCODONE HCL 5 MG PO TABS
5.0000 mg | ORAL_TABLET | Freq: Once | ORAL | Status: AC
  Administered 2023-11-02: 5 mg via ORAL
  Filled 2023-11-02: qty 1

## 2023-11-02 MED ORDER — OXYCODONE HCL 5 MG PO TABS: 5.0000 mg | ORAL_TABLET | Freq: Four times a day (QID) | ORAL | 0 refills | Status: DC | PRN

## 2023-11-02 NOTE — Discharge Instructions (Signed)
 Follow-up with your primary care provider if any continued problems or concerns.  A prescription for oxycodone  was sent to the pharmacy for the next 2 days.  You should call make an appointment with your provider for additional pain medication as needed.  You may also use ice to your shoulder and upper extremity as needed for pain.

## 2023-11-02 NOTE — ED Triage Notes (Signed)
 Pt via POV from home. Pt c/o R shoulder pain. Reports falling out of bed last night. Pt has a hx of osteonecrosis. Pt is A&OX4 and NAD, ambulatory to triage with steady gait.

## 2023-11-02 NOTE — ED Provider Notes (Signed)
 [facility] Provider Note    Event Date/Time   First MD Initiated Contact with Patient 11/02/23 [phone]     (approximate)   History   Fall and Shoulder Pain   HPI  Stephen Mayer is a 48 y.o. male   presents to the ED with complaint of right shoulder pain after falling out of bed last evening.  Patient states he has a history of osteonecrosis and is worried that he has an injury to his shoulder.  Patient denies any head injury or loss of consciousness.  No over-the-counter medications been taken as he states he has issues with his kidneys and liver functions.  Patient has documented history of CKD, liver failure, alcoholic hepatitis, pancreatitis, ascites, EtOH abuse.      Physical Exam   Triage Vital Signs: ED Triage Vitals  Encounter Vitals Group     BP 11/02/23 0853 (!) 149/85     Girls Systolic BP Percentile --      Girls Diastolic BP Percentile --      Boys Systolic BP Percentile --      Boys Diastolic BP Percentile --      Pulse Rate 11/02/23 0853 99     Resp 11/02/23 0853 20     Temp 11/02/23 0853 98.9 F (37.2 C)     Temp Source 11/02/23 0853 Oral     SpO2 11/02/23 0853 99 %     Weight 11/02/23 0852 183 lb (83 kg)     Height 11/02/23 0852 5' 7 (1.702 m)     Head Circumference --      Peak Flow --      Pain Score 11/02/23 0852 10     Pain Loc --      Pain Education --      Exclude from Growth Chart --     Most recent vital signs: Vitals:   11/02/23 0853  BP: (!) 149/85  Pulse: 99  Resp: 20  Temp: 98.9 F (37.2 C)  SpO2: 99%     General: Awake, no distress.  Alert, talkative, cooperative. CV:  Good peripheral perfusion.  Heart regular rate rhythm. Resp:  Normal effort.  Lungs clear bilaterally. Abd:  No distention.  Other:  There is diffuse tenderness on palpation of the clavicle especially at the [facility] joint area with decreased movement secondary to increased pain.  No gross deformities noted.  Patient also has pain posterior  olecranon and midshaft radius and ulna.  Skin is intact.  No soft tissue edema or discoloration is noted.  Radial pulses present.  Range of motion is slow and guarded secondary to increased pain.  Motor or sensory function intact distal to the injury.   ED Results / Procedures / Treatments   Labs (all labs ordered are listed, but only abnormal results are displayed) Labs Reviewed - No data to display   RADIOLOGY  X-ray images of the right shoulder were reviewed and interpreted by myself independent of the radiologist and was negative for acute fracture however some degenerative changes were noted. Right forearm x-ray images were reviewed and interpreted by myself independent of the radiologist and was negative for fracture or dislocation.  Noted mild degenerative changes.  Official radiology report for both is negative for acute injury.   PROCEDURES:  Critical Care performed:   Procedures   MEDICATIONS ORDERED IN ED: Medications  oxyCODONE  (Oxy IR/ROXICODONE ) immediate release tablet 5 mg (5 mg Oral Given 11/02/23 1006)     IMPRESSION /  MDM / ASSESSMENT AND PLAN / ED COURSE  I reviewed the triage vital signs and the nursing notes.   Differential diagnosis includes, but is not limited to, fracture, contusion, dislocation right shoulder and right forearm.  Fall at home.  48 year old male presents to the ED with complaint of right upper extremity pain after falling out of bed at home.  Patient x-rays were negative and he was made aware.  Because of his chronic kidney disease and liver functions he is unable to take over-the-counter medication.  I explained to him that a limited amount of oxycodone  would be sent to the pharmacy for 2 days however he would need to talk to his PCP about any continued pain medication.  He is strongly encouraged to follow-up with his PCP and also use ice to the extremity.  A sling was offered however patient declined it at this visit.      Patient's  presentation is most consistent with acute complicated illness / injury requiring diagnostic workup.  FINAL CLINICAL IMPRESSION(S) / ED DIAGNOSES   Final diagnoses:  Musculoskeletal pain of right upper extremity     Rx / DC Orders   ED Discharge Orders          Ordered    oxyCODONE  (OXY IR/ROXICODONE ) 5 MG immediate release tablet  Every 6 hours PRN        11/02/23 1139             Note:  This document was prepared using Dragon voice recognition software and may include unintentional dictation errors.   Saunders Shona CROME, PA-C 11/02/23 1324    Viviann Pastor, MD 11/02/23 [phone]

## 2023-11-03 ENCOUNTER — Other Ambulatory Visit: Payer: Self-pay | Admitting: Orthopedic Surgery

## 2023-11-08 ENCOUNTER — Other Ambulatory Visit

## 2023-11-09 ENCOUNTER — Emergency Department

## 2023-11-09 ENCOUNTER — Ambulatory Visit
Admission: RE | Admit: 2023-11-09 | Discharge: 2023-11-09 | Disposition: A | Discharge status: Home / Self Care | Source: Ambulatory Visit | Attending: Orthopedic Surgery | Admitting: Orthopedic Surgery

## 2023-11-09 ENCOUNTER — Encounter: Payer: Self-pay | Admitting: Emergency Medicine

## 2023-11-09 ENCOUNTER — Emergency Department
Admission: EM | Admit: 2023-11-09 | Discharge: 2023-11-09 | Disposition: A | Discharge status: Home / Self Care | Attending: Emergency Medicine | Admitting: Emergency Medicine

## 2023-11-09 ENCOUNTER — Other Ambulatory Visit: Payer: Self-pay

## 2023-11-09 DIAGNOSIS — S99911A Unspecified injury of right ankle, initial encounter: Secondary | ICD-10-CM | POA: Insufficient documentation

## 2023-11-09 DIAGNOSIS — W010XXA Fall on same level from slipping, tripping and stumbling without subsequent striking against object, initial encounter: Secondary | ICD-10-CM | POA: Diagnosis not present

## 2023-11-09 DIAGNOSIS — M19011 Primary osteoarthritis, right shoulder: Secondary | ICD-10-CM

## 2023-11-09 MED ORDER — KETOROLAC TROMETHAMINE 15 MG/ML IJ SOLN
15.0000 mg | Freq: Once | INTRAMUSCULAR | Status: AC
  Administered 2023-11-09: 15 mg via INTRAMUSCULAR
  Filled 2023-11-09: qty 1

## 2023-11-09 NOTE — Discharge Instructions (Signed)
 Your x-rays did not reveal any acute broken bones.  Please follow-up with podiatry.  Rest, ice, elevate your foot.  Please return for any new, worsening, or changing symptoms or other concerns.  It was a pleasure caring for you today.

## 2023-11-09 NOTE — ED Triage Notes (Signed)
 Patient to ED via POV for a fall. Pt reports tripping on porch on Sunday. C/o right ankle pain. Ambulatory to triage.

## 2023-11-09 NOTE — ED Notes (Signed)
 See triage note Presents s/p fall  States fell from porch last weekend   Having pain to right ankle   Ambulates with slight limp

## 2023-11-09 NOTE — ED Provider Notes (Signed)
 [facility] Provider Note    Event Date/Time   First MD Initiated Contact with Patient 11/09/23 [phone]     (approximate)   History   Fall   HPI  Stephen Mayer is a 48 y.o. male with a past medical history of alcohol abuse with cirrhosis who presents today for evaluation of foot and ankle injury that occurred 4 days ago.  Patient reports that he stepped through a stair that broke.  He is unsure if he twisted his ankle.  He reports that he was not drinking at the time.  He denies head strike or LOC.  He reports that he has been able to ambulate but has had pain with ambulation.  He feels that his ankle is swollen and the swelling is spreading up his leg.  He has not had any numbness or tingling.  He reports that he has noticed bruising to his ankle as well.  He denies any swelling prior to the injury.  He has never had a PE or DVT.  Patient Active Problem List   Diagnosis Date Noted   Acute on chronic pancreatitis (HCC) 01/11/2023   Anemia 01/11/2023   Alcoholic hepatitis without ascites 09/08/2022   Acute alcoholic hepatitis 09/07/2022   ETOH abuse 09/04/2022   Hyperbilirubinemia 09/04/2022   Chronic pain 08/25/2022   Elevated LFTs 08/25/2022   Alcoholic cirrhosis of liver without ascites (HCC) 08/25/2022   Pancytopenia (HCC) 08/25/2022          Physical Exam   Triage Vital Signs: ED Triage Vitals  Encounter Vitals Group     BP 11/09/23 0928 (!) 141/87     Girls Systolic BP Percentile --      Girls Diastolic BP Percentile --      Boys Systolic BP Percentile --      Boys Diastolic BP Percentile --      Pulse Rate 11/09/23 0928 (!) 101     Resp 11/09/23 0928 17     Temp 11/09/23 0928 98 F (36.7 C)     Temp Source 11/09/23 0928 Oral     SpO2 11/09/23 0928 100 %     Weight 11/09/23 0929 183 lb (83 kg)     Height 11/09/23 0929 5' 7 (1.702 m)     Head Circumference --      Peak Flow --      Pain Score 11/09/23 0929 10     Pain Loc --       Pain Education --      Exclude from Growth Chart --     Most recent vital signs: Vitals:   11/09/23 0928  BP: (!) 141/87  Pulse: (!) 101  Resp: 17  Temp: 98 F (36.7 C)  SpO2: 100%    Physical Exam Vitals and nursing note reviewed.  Constitutional:      General: Awake and alert.  Appears uncomfortable    Appearance: Normal appearance. The patient is normal weight.  HENT:     Head: Normocephalic and atraumatic.     Mouth: Mucous membranes are moist.  Eyes:     General: PERRL. Normal EOMs        Right eye: No discharge.        Left eye: No discharge.     Conjunctiva/sclera: Conjunctivae normal.  Cardiovascular:     Rate and Rhythm: Normal rate and regular rhythm.     Pulses: Normal pulses.  Pulmonary:     Effort: Pulmonary effort is normal. No  respiratory distress.     Breath sounds: Normal breath sounds.  Abdominal:     Abdomen is soft. There is mild right sided abdominal tenderness. No rebound or guarding. No distention.  No CVAT Musculoskeletal:        General: No swelling. Normal range of motion.     Cervical back: Normal range of motion and neck supple.  Right ankle: Tenderness and swelling over the anterior talofibular ligament and posterior to lateral malleolus, no specific lateral or medial malleolar tenderness or proximal fifth metacarpal tenderness.  There is ecchymosis just inferior to both the lateral and medial malleolus.  No proximal fibular tenderness. 2+ pedal pulses with brisk capillary refill. Intact distal sensation and strength with normal ROM. Able to plantar flex and dorsiflex against resistance. Able to invert and evert against resistance. Negative Thompson test.  Superficial abrasion to medial leg without surrounding erythema.  No lymphangitis Skin:    General: Skin is warm and dry.     Capillary Refill: Capillary refill takes less than 2 seconds.     Findings: No rash.  Neurological:     Mental Status: The patient is awake and alert.      ED  Results / Procedures / Treatments   Labs (all labs ordered are listed, but only abnormal results are displayed) Labs Reviewed - No data to display   EKG     RADIOLOGY I independently reviewed and interpreted imaging and agree with radiologists findings.     PROCEDURES:  Critical Care performed:   Procedures   MEDICATIONS ORDERED IN ED: Medications  ketorolac  (TORADOL ) 15 MG/ML injection 15 mg (15 mg Intramuscular Given 11/09/23 1202)     IMPRESSION / MDM / ASSESSMENT AND PLAN / ED COURSE  I reviewed the triage vital signs and the nursing notes.   Differential diagnosis includes, but is not limited to, fracture, dislocation, ligamental injury.  Patient is awake and alert, hemodynamically stable and neurovascular intact.  He is obvious swelling and ecchymosis noted to his right ankle, though his foot is warm and well-perfused and he has palpable pedal pulses that are equal to opposite.  X-rays obtained and are negative for any acute findings.  No swelling prior to the injury, do not suspect DVT.  No history of DVT.  He has an abrasion to the inner aspect of his lower leg, no surrounding erythema or evidence of infection at this time.  I recommended that he keep this clean with soap and water.  Patient was given a cam boot for protection instructed follow-up with podiatry.  The appropriate follow-up information was provided.  We discussed return precautions and outpatient follow-up.  Patient understands and agrees with plan.  He was discharged in stable condition.   Patient's presentation is most consistent with acute complicated illness / injury requiring diagnostic workup.    FINAL CLINICAL IMPRESSION(S) / ED DIAGNOSES   Final diagnoses:  Injury of right ankle, initial encounter     Rx / DC Orders   ED Discharge Orders     None        Note:  This document was prepared using Dragon voice recognition software and may include unintentional dictation errors.    Devri Kreher E, PA-C 11/09/23 1354    Dicky Anes, MD 11/09/23 1626

## 2023-11-16 ENCOUNTER — Other Ambulatory Visit: Payer: Self-pay | Admitting: Orthopedic Surgery

## 2023-11-27 ENCOUNTER — Other Ambulatory Visit: Payer: Self-pay | Admitting: Orthopedic Surgery

## 2023-11-27 ENCOUNTER — Telehealth: Payer: Self-pay | Admitting: Orthopedic Surgery

## 2023-11-27 DIAGNOSIS — M25452 Effusion, left hip: Secondary | ICD-10-CM

## 2023-11-27 DIAGNOSIS — G8929 Other chronic pain: Secondary | ICD-10-CM

## 2023-11-27 NOTE — Telephone Encounter (Signed)
 Referral was placed

## 2023-11-27 NOTE — Addendum Note (Signed)
 Addended by: VANDERBILT LIONEL CROME on: 11/27/2023 01:53 PM   Modules accepted: Orders

## 2023-11-27 NOTE — Telephone Encounter (Signed)
**Note De-identified  [place] Obfuscation** Please advise 

## 2023-11-27 NOTE — Telephone Encounter (Signed)
 Dr. Onesimo pt Stephen Mayer - pt lvm stating that he's had an infection in his hip for over a yeart now and that Dr. JAYSON was supposed to do something for it.  He stated that Dr. JAYSON isn't doing anything.  He stated he is in severe pain and he's frustrated.  He is requesting a call back [phone].

## 2023-11-29 ENCOUNTER — Telehealth: Payer: Self-pay | Admitting: Physician Assistant

## 2023-11-29 NOTE — Telephone Encounter (Signed)
 Per Niels reinhold Gaba, referral has been closed due to patient not returning calls -Andree

## 2023-12-04 ENCOUNTER — Telehealth: Payer: Self-pay | Admitting: Orthopedic Surgery

## 2023-12-04 ENCOUNTER — Other Ambulatory Visit: Payer: Self-pay | Admitting: Orthopedic Surgery

## 2023-12-04 NOTE — Telephone Encounter (Signed)
 Dr. Onesimo pt - pt lvm stating he has an appointment w/[facility] 11/17.  He stated that he'll have to take some test for anesthesia.  He stated that he doesn't understand everything.  He stated that he wanted to give you an update.  He is having more pain in that infected area and there is blood mixed in, feels like a stiletto heel jabbing in the infected area.  He is requesting a refill for his pain meds, but would like something stronger, if possible, he stated he is having to take two at a time.  Oxycodone  5mg  is what was sent in before.  [phone]

## 2023-12-04 NOTE — Telephone Encounter (Signed)
 He states its [place]  The appointment is for his hip  From Dr you referred him to  Providers  Trine, Lonni Fallow, MD Orthopedic Surgery NPI: [id number] [address] [place] [address]   Phone: [phone] Fax: [phone]

## 2023-12-08 ENCOUNTER — Other Ambulatory Visit: Payer: Self-pay

## 2023-12-11 ENCOUNTER — Other Ambulatory Visit: Payer: Self-pay | Admitting: Orthopedic Surgery

## 2023-12-13 ENCOUNTER — Telehealth: Payer: Self-pay | Admitting: Orthopedic Surgery

## 2023-12-13 NOTE — Telephone Encounter (Signed)
 Dr. Onesimo pt Stephen Mayer w/[employer] [phone] lvm in regards to his Oxycodone , she stated that she was told from the prior authorization department that the prior autho from January states he's supposed to be taken twice a day every day, but the pt is stating he's taking it four times a day.  She wants a new autho sent over to [phone] stating four times a day, so it doesn't cause the pt issues.

## 2023-12-14 NOTE — Telephone Encounter (Signed)
**Note De-identified  [place] Obfuscation** Please advise 

## 2023-12-18 ENCOUNTER — Other Ambulatory Visit: Payer: Self-pay | Admitting: Orthopedic Surgery

## 2023-12-21 NOTE — Telephone Encounter (Signed)
 Attempted to complete on Cover[employer]eds not sure if it was approved due to questions that were asked about previously failed medicine treatments.

## 2023-12-25 ENCOUNTER — Other Ambulatory Visit: Payer: Self-pay | Admitting: Orthopedic Surgery
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
## 2024-01-03 ENCOUNTER — Other Ambulatory Visit: Payer: Self-pay | Admitting: Orthopedic Surgery

## 2024-01-08 ENCOUNTER — Other Ambulatory Visit: Payer: Self-pay | Admitting: Orthopedic Surgery

## 2024-01-11 ENCOUNTER — Emergency Department (HOSPITAL_COMMUNITY)

## 2024-01-11 ENCOUNTER — Other Ambulatory Visit: Payer: Self-pay

## 2024-01-11 ENCOUNTER — Emergency Department (HOSPITAL_COMMUNITY): Admission: EM | Admit: 2024-01-11 | Discharge: 2024-01-11 | Disposition: A | Discharge status: Home / Self Care

## 2024-01-11 DIAGNOSIS — Y92007 Garden or yard of unspecified non-institutional (private) residence as the place of occurrence of the external cause: Secondary | ICD-10-CM | POA: Diagnosis not present

## 2024-01-11 DIAGNOSIS — S42021A Displaced fracture of shaft of right clavicle, initial encounter for closed fracture: Secondary | ICD-10-CM | POA: Diagnosis not present

## 2024-01-11 DIAGNOSIS — S4991XA Unspecified injury of right shoulder and upper arm, initial encounter: Secondary | ICD-10-CM | POA: Diagnosis present

## 2024-01-11 LAB — CBC WITH DIFFERENTIAL/PLATELET
Abs Immature Granulocytes: 0.04 K/uL (ref 0.00–0.07)
Basophils Absolute: 0.1 K/uL (ref 0.0–0.1)
Basophils Relative: 2 %
Eosinophils Absolute: 0.2 K/uL (ref 0.0–0.5)
Eosinophils Relative: 3 %
HCT: 32.8 % — ABNORMAL LOW (ref 39.0–52.0)
Hemoglobin: 10 g/dL — ABNORMAL LOW (ref 13.0–17.0)
Immature Granulocytes: 1 %
Lymphocytes Relative: 24 %
Lymphs Abs: 1.5 K/uL (ref 0.7–4.0)
MCH: 24.3 pg — ABNORMAL LOW (ref 26.0–34.0)
MCHC: 30.5 g/dL (ref 30.0–36.0)
MCV: 79.6 fL — ABNORMAL LOW (ref 80.0–100.0)
Monocytes Absolute: 0.8 K/uL (ref 0.1–1.0)
Monocytes Relative: 12 %
Neutro Abs: 3.8 K/uL (ref 1.7–7.7)
Neutrophils Relative %: 58 %
Platelets: 290 K/uL (ref 150–400)
RBC: 4.12 MIL/uL — ABNORMAL LOW (ref 4.22–5.81)
RDW: 18.6 % — ABNORMAL HIGH (ref 11.5–15.5)
WBC: 6.5 K/uL (ref 4.0–10.5)
nRBC: 0 % (ref 0.0–0.2)

## 2024-01-11 LAB — COMPREHENSIVE METABOLIC PANEL WITH GFR
ALT: 18 U/L (ref 0–44)
AST: 41 U/L (ref 15–41)
Albumin: 2.6 g/dL — ABNORMAL LOW (ref 3.5–5.0)
Alkaline Phosphatase: 142 U/L — ABNORMAL HIGH (ref 38–126)
Anion gap: 11 (ref 5–15)
BUN: 8 mg/dL (ref 6–20)
CO2: 19 mmol/L — ABNORMAL LOW (ref 22–32)
Calcium: 8.6 mg/dL — ABNORMAL LOW (ref 8.9–10.3)
Chloride: 110 mmol/L (ref 98–111)
Creatinine, Ser: 0.76 mg/dL (ref 0.61–1.24)
GFR, Estimated: >60 mL/min (ref >60)
Glucose, Bld: 113 mg/dL — ABNORMAL HIGH (ref 70–99)
Potassium: 3.6 mmol/L (ref 3.5–5.1)
Sodium: 140 mmol/L (ref 135–145)
Total Bilirubin: 0.8 mg/dL (ref 0.0–1.2)
Total Protein: 7.3 g/dL (ref 6.5–8.1)

## 2024-01-11 LAB — URINALYSIS, ROUTINE W REFLEX MICROSCOPIC
Bilirubin Urine: NEGATIVE
Glucose, UA: NEGATIVE mg/dL
Hgb urine dipstick: NEGATIVE
Ketones, ur: NEGATIVE mg/dL
Nitrite: NEGATIVE
Protein, ur: NEGATIVE mg/dL
Specific Gravity, Urine: 1.006 (ref 1.005–1.030)
pH: 6 (ref 5.0–8.0)

## 2024-01-11 LAB — LIPASE, BLOOD: Lipase: 41 U/L (ref 11–51)

## 2024-01-11 MED ORDER — MORPHINE SULFATE (PF) 2 MG/ML IV SOLN
2.0000 mg | Freq: Once | INTRAVENOUS | Status: AC
  Administered 2024-01-11: 2 mg via INTRAVENOUS
  Filled 2024-01-11: qty 1

## 2024-01-11 MED ORDER — LIDOCAINE 5 % EX PTCH
1.0000 | MEDICATED_PATCH | CUTANEOUS | Status: DC
  Administered 2024-01-11: 1 via TRANSDERMAL
  Filled 2024-01-11: qty 1

## 2024-01-11 MED ORDER — OXYCODONE HCL 5 MG PO TABS
5.0000 mg | ORAL_TABLET | Freq: Once | ORAL | Status: AC
  Administered 2024-01-11: 5 mg via ORAL
  Filled 2024-01-11: qty 1

## 2024-01-11 MED ORDER — OXYCODONE HCL 5 MG PO TABS: 5.0000 mg | ORAL_TABLET | ORAL | 0 refills | Status: DC | PRN

## 2024-01-11 MED ORDER — LIDOCAINE 5 % EX PTCH: 1.0000 | MEDICATED_PATCH | CUTANEOUS | 0 refills | Status: DC

## 2024-01-11 NOTE — Discharge Instructions (Addendum)
 Please give the above listed orthopedic team a call about your clavicle.  You will likely need surgery for this  If you have any numbness or tingling of your right arm or hand then please come back to the ED  For pain, you can take oxycodone .  You can also apply lidocaine  patches.  I will call in both to pharmacy.  Do not take the oxycodone  with other sedative medications.  Do not take with alcohol.  Do not take this and drive

## 2024-01-11 NOTE — Progress Notes (Signed)
 Orthopedic Tech Progress Note Patient Details:  Stephen Mayer 1975/06/10 [id number] Left it @ Bedside. RN was able to apply it.  Ortho Devices Type of Ortho Device: Shoulder immobilizer Ortho Device/Splint Location: R CLAVICLE Ortho Device/Splint Interventions: Ordered   Post Interventions Patient Tolerated: Other (comment) Instructions Provided: Care of device  Zeppelin Beckstrand L Laquentin Loudermilk 01/11/2024, 9:03 PM
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
## 2024-01-11 NOTE — Progress Notes (Signed)
 Orthopedic Tech Progress Note Patient Details:  Stephen Mayer 22-Oct-1975 [id number] Secured chatted the RN and MD due to the patient requesting more pain meds ,removal of IV and putting on his shirt before the sling is applied.  Patient ID: Stephen Mayer, male   DOB: Dec 03, 1975, 48 y.o.   MRN: [id number]  [name] 01/11/2024, 8:21 PM
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
## 2024-01-15 ENCOUNTER — Ambulatory Visit: Admitting: Physician Assistant

## 2024-01-15 ENCOUNTER — Encounter: Payer: Self-pay | Admitting: *Deleted

## 2024-01-15 ENCOUNTER — Emergency Department
Admission: EM | Admit: 2024-01-15 | Discharge: 2024-01-15 | Disposition: A | Discharge status: Home / Self Care | Attending: Emergency Medicine | Admitting: Emergency Medicine

## 2024-01-15 ENCOUNTER — Other Ambulatory Visit: Payer: Self-pay | Admitting: Orthopedic Surgery

## 2024-01-15 ENCOUNTER — Telehealth: Payer: Self-pay | Admitting: Physician Assistant

## 2024-01-15 ENCOUNTER — Other Ambulatory Visit: Payer: Self-pay

## 2024-01-15 DIAGNOSIS — N189 Chronic kidney disease, unspecified: Secondary | ICD-10-CM | POA: Insufficient documentation

## 2024-01-15 DIAGNOSIS — S42021D Displaced fracture of shaft of right clavicle, subsequent encounter for fracture with routine healing: Secondary | ICD-10-CM | POA: Insufficient documentation

## 2024-01-15 DIAGNOSIS — S4991XD Unspecified injury of right shoulder and upper arm, subsequent encounter: Secondary | ICD-10-CM | POA: Diagnosis present

## 2024-01-15 DIAGNOSIS — W19XXXD Unspecified fall, subsequent encounter: Secondary | ICD-10-CM | POA: Diagnosis not present

## 2024-01-15 MED ORDER — LIDOCAINE 5 % EX PTCH: 1.0000 | MEDICATED_PATCH | CUTANEOUS | 0 refills | Status: AC

## 2024-01-15 MED ORDER — OXYCODONE HCL 5 MG PO TABS
10.0000 mg | ORAL_TABLET | Freq: Once | ORAL | Status: AC
  Administered 2024-01-15: 10 mg via ORAL
  Filled 2024-01-15: qty 2

## 2024-01-15 MED ORDER — LIDOCAINE 5 % EX PTCH
1.0000 | MEDICATED_PATCH | CUTANEOUS | Status: DC
  Administered 2024-01-15: 1 via TRANSDERMAL
  Filled 2024-01-15: qty 1
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
## 2024-01-15 NOTE — Discharge Instructions (Addendum)
 Please wear the sling at all times unless you are showering.  When you remove it to shower do not lift your arm above your shoulder.  The sling needs to be secured to your back.  I have sent a prescription for lidocaine  patches to the pharmacy.  These can be worn for 12 hours at a time.  Then it needs to be removed for 12 hours before replacing it.  You can take 650 mg of Tylenol  and 600 mg of ibuprofen  every 6 hours as needed for pain. You can use ice, heat, muscle creams and other topical pain relievers as well.  Please follow-up with the orthopedic provider whose information is attached.    Please use the incentive spirometer at least 3 times a day.  This is to help expand your lungs and prevent development of pneumonia which is common after this type of injury.  Return to the emergency department with any worsening symptoms.
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
## 2024-01-22 ENCOUNTER — Other Ambulatory Visit: Payer: Self-pay | Admitting: Orthopedic Surgery

## 2024-01-29 ENCOUNTER — Other Ambulatory Visit: Payer: Self-pay | Admitting: Orthopedic Surgery

## 2024-02-02 ENCOUNTER — Other Ambulatory Visit: Payer: Self-pay | Admitting: Orthopedic Surgery

## 2024-02-09 ENCOUNTER — Other Ambulatory Visit: Payer: Self-pay | Admitting: Orthopedic Surgery
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
## 2024-02-19 ENCOUNTER — Other Ambulatory Visit: Payer: Self-pay | Admitting: Orthopedic Surgery

## 2024-02-26 ENCOUNTER — Other Ambulatory Visit: Payer: Self-pay | Admitting: Orthopedic Surgery

## 2024-03-04 ENCOUNTER — Other Ambulatory Visit: Payer: Self-pay | Admitting: Orthopedic Surgery

## 2024-03-08 ENCOUNTER — Other Ambulatory Visit: Payer: Self-pay | Admitting: Orthopedic Surgery

## 2024-03-12 ENCOUNTER — Telehealth: Payer: Self-pay | Admitting: Orthopedic Surgery

## 2024-03-12 ENCOUNTER — Telehealth: Payer: Self-pay | Admitting: Physician Assistant

## 2024-03-12 NOTE — Telephone Encounter (Signed)
 01/17/23-present MR faxed to [facility]; [phone]

## 2024-03-12 NOTE — Telephone Encounter (Signed)
 Dr. Onesimo pt - received a fax from [employer], pt's Oxycodone  HCI 5mg  requires a prior autho, the fax is in Leta's box.

## 2024-03-13 NOTE — Telephone Encounter (Signed)
 Spoke w/ provider who feels pt may get better results going to Pain Management for longer medication refills. Called pt who states he's ok with getting weekly refills at this time. Pt has been sent to Pain Management and case was denied due to pt needing multiple surgeries. Pt is currently completing necessary steps so he can be cleared for surgery. Would like to continue current course as long as he can.

## 2024-03-14 ENCOUNTER — Other Ambulatory Visit: Payer: Self-pay | Admitting: Orthopedic Surgery

## 2024-03-22 ENCOUNTER — Other Ambulatory Visit: Payer: Self-pay | Admitting: Orthopedic Surgery

## 2024-08-08 ENCOUNTER — Encounter: Admitting: Physician Assistant
